# Patient Record
Sex: Female | Born: 1940 | Race: White | Hispanic: No | Marital: Married | Smoking: Never smoker
Health system: Southern US, Community
[De-identification: ages and names within clinical notes are randomized; demographics above are authoritative.]

## PROBLEM LIST (undated history)

## (undated) DIAGNOSIS — M810 Age-related osteoporosis without current pathological fracture: Secondary | ICD-10-CM

## (undated) DIAGNOSIS — M1990 Unspecified osteoarthritis, unspecified site: Secondary | ICD-10-CM

## (undated) DIAGNOSIS — E7800 Pure hypercholesterolemia, unspecified: Secondary | ICD-10-CM

## (undated) DIAGNOSIS — I10 Essential (primary) hypertension: Secondary | ICD-10-CM

## (undated) HISTORY — PX: ABDOMINAL HYSTERECTOMY: SHX81

## (undated) HISTORY — PX: CHOLECYSTECTOMY: SHX55

## (undated) HISTORY — PX: TONSILLECTOMY: SUR1361

---

## 2020-07-18 ENCOUNTER — Encounter (HOSPITAL_BASED_OUTPATIENT_CLINIC_OR_DEPARTMENT_OTHER): Payer: Self-pay

## 2020-07-18 ENCOUNTER — Emergency Department (HOSPITAL_BASED_OUTPATIENT_CLINIC_OR_DEPARTMENT_OTHER): Payer: Medicare Other

## 2020-07-18 ENCOUNTER — Other Ambulatory Visit: Payer: Self-pay

## 2020-07-18 ENCOUNTER — Inpatient Hospital Stay (HOSPITAL_BASED_OUTPATIENT_CLINIC_OR_DEPARTMENT_OTHER)
Admission: EM | Admit: 2020-07-18 | Discharge: 2020-07-21 | DRG: 872 | Disposition: A | Discharge status: Home / Self Care | Payer: Medicare Other | Attending: Family Medicine | Admitting: Family Medicine

## 2020-07-18 DIAGNOSIS — M810 Age-related osteoporosis without current pathological fracture: Secondary | ICD-10-CM | POA: Diagnosis present

## 2020-07-18 DIAGNOSIS — K529 Noninfective gastroenteritis and colitis, unspecified: Principal | ICD-10-CM | POA: Diagnosis present

## 2020-07-18 DIAGNOSIS — A419 Sepsis, unspecified organism: Secondary | ICD-10-CM | POA: Diagnosis not present

## 2020-07-18 DIAGNOSIS — E7800 Pure hypercholesterolemia, unspecified: Secondary | ICD-10-CM | POA: Diagnosis present

## 2020-07-18 DIAGNOSIS — E860 Dehydration: Secondary | ICD-10-CM | POA: Diagnosis not present

## 2020-07-18 DIAGNOSIS — E876 Hypokalemia: Secondary | ICD-10-CM | POA: Diagnosis present

## 2020-07-18 DIAGNOSIS — Z90710 Acquired absence of both cervix and uterus: Secondary | ICD-10-CM

## 2020-07-18 DIAGNOSIS — D649 Anemia, unspecified: Secondary | ICD-10-CM | POA: Diagnosis present

## 2020-07-18 DIAGNOSIS — D72829 Elevated white blood cell count, unspecified: Secondary | ICD-10-CM

## 2020-07-18 DIAGNOSIS — M069 Rheumatoid arthritis, unspecified: Secondary | ICD-10-CM | POA: Diagnosis present

## 2020-07-18 DIAGNOSIS — I10 Essential (primary) hypertension: Secondary | ICD-10-CM | POA: Diagnosis present

## 2020-07-18 DIAGNOSIS — Z20822 Contact with and (suspected) exposure to covid-19: Secondary | ICD-10-CM | POA: Diagnosis present

## 2020-07-18 DIAGNOSIS — Z9049 Acquired absence of other specified parts of digestive tract: Secondary | ICD-10-CM

## 2020-07-18 HISTORY — DX: Pure hypercholesterolemia, unspecified: E78.00

## 2020-07-18 HISTORY — DX: Essential (primary) hypertension: I10

## 2020-07-18 HISTORY — DX: Unspecified osteoarthritis, unspecified site: M19.90

## 2020-07-18 HISTORY — DX: Age-related osteoporosis without current pathological fracture: M81.0

## 2020-07-18 LAB — CBC
HCT: 40 % (ref 36.0–46.0)
Hemoglobin: 11.7 g/dL — ABNORMAL LOW (ref 12.0–15.0)
MCH: 23.9 pg — ABNORMAL LOW (ref 26.0–34.0)
MCHC: 29.3 g/dL — ABNORMAL LOW (ref 30.0–36.0)
MCV: 81.8 fL (ref 80.0–100.0)
Platelets: 576 10*3/uL — ABNORMAL HIGH (ref 150–400)
RBC: 4.89 MIL/uL (ref 3.87–5.11)
RDW: 17.1 % — ABNORMAL HIGH (ref 11.5–15.5)
WBC: 21.4 10*3/uL — ABNORMAL HIGH (ref 4.0–10.5)
nRBC: 0 % (ref 0.0–0.2)

## 2020-07-18 LAB — COMPREHENSIVE METABOLIC PANEL
ALT: 12 U/L (ref 0–44)
AST: 22 U/L (ref 15–41)
Albumin: 3.6 g/dL (ref 3.5–5.0)
Alkaline Phosphatase: 81 U/L (ref 38–126)
Anion gap: 13 (ref 5–15)
BUN: 24 mg/dL — ABNORMAL HIGH (ref 8–23)
CO2: 25 mmol/L (ref 22–32)
Calcium: 9.3 mg/dL (ref 8.9–10.3)
Chloride: 97 mmol/L — ABNORMAL LOW (ref 98–111)
Creatinine, Ser: 0.8 mg/dL (ref 0.44–1.00)
GFR, Estimated: >60 mL/min (ref >60)
Glucose, Bld: 155 mg/dL — ABNORMAL HIGH (ref 70–99)
Potassium: 4.1 mmol/L (ref 3.5–5.1)
Sodium: 135 mmol/L (ref 135–145)
Total Bilirubin: 0.7 mg/dL (ref 0.3–1.2)
Total Protein: 6.7 g/dL (ref 6.5–8.1)

## 2020-07-18 LAB — LACTIC ACID, PLASMA
Lactic Acid, Venous: 3.8 mmol/L (ref 0.5–1.9)
Lactic Acid, Venous: 2.9 mmol/L (ref 0.5–1.9)

## 2020-07-18 LAB — LIPASE, BLOOD: Lipase: 19 U/L (ref 11–51)

## 2020-07-18 MED ORDER — METRONIDAZOLE IN NACL 5-0.79 MG/ML-% IV SOLN
500.0000 mg | Freq: Three times a day (TID) | INTRAVENOUS | Status: DC
  Administered 2020-07-18 – 2020-07-21 (×8): 500 mg via INTRAVENOUS
  Filled 2020-07-18 (×9): qty 100

## 2020-07-18 MED ORDER — SODIUM CHLORIDE 0.9 % IV BOLUS
1000.0000 mL | Freq: Once | INTRAVENOUS | Status: AC
  Administered 2020-07-18: 1000 mL via INTRAVENOUS

## 2020-07-18 MED ORDER — SODIUM CHLORIDE 0.9 % IV BOLUS
500.0000 mL | Freq: Once | INTRAVENOUS | Status: AC
  Administered 2020-07-18: 500 mL via INTRAVENOUS

## 2020-07-18 MED ORDER — IOHEXOL 300 MG/ML  SOLN
100.0000 mL | Freq: Once | INTRAMUSCULAR | Status: AC | PRN
  Administered 2020-07-18: 75 mL via INTRAVENOUS

## 2020-07-18 MED ORDER — VANCOMYCIN 50 MG/ML ORAL SOLUTION
500.0000 mg | Freq: Four times a day (QID) | ORAL | Status: DC
  Administered 2020-07-19: 500 mg via ORAL
  Filled 2020-07-18 (×7): qty 10

## 2020-07-18 NOTE — ED Notes (Signed)
Date and time results received: 07/18/20 2032  Test: lactic acid Critical Value: 3.8 Name of Provider Notified: Dr. Madilyn Hook  Orders Received? Or Actions Taken?: no new orders

## 2020-07-18 NOTE — ED Triage Notes (Signed)
Pt reports generalized abdominal pain and dirrhea since this morning. Pt also report dizziness and feeling weak along with not eating or drinking much today.

## 2020-07-18 NOTE — ED Notes (Signed)
Date and time results received: 07/18/20 2240 (use smartphrase ".now" to insert current time)  Test: Lactic acid  Critical Value: 2.9  Name of Provider Notified: Madilyn Hook  Orders Received? Or Actions Taken?: none

## 2020-07-18 NOTE — ED Provider Notes (Signed)
[facility] EMERGENCY DEPARTMENT Provider Note   CSN: [id number] Arrival date & time: 07/18/20  1810     History Chief Complaint  Patient presents with  . Diarrhea    Carol Baxter is a 80 y.o. female.  The history is provided by the patient, medical records and the spouse.  Diarrhea  Carol Baxter is a 80 y.o. female who presents to the Emergency Department complaining of diarrhea. She presents the emergency department accompanied by her husband for evaluation of diarrhea that began this morning. She reports profuse diarrhea, to numerous to count episodes since 10 AM. She has mild abdominal discomfort. She does have episodic belts with diarrhea but this is the most severe episode she is had. She denies any fevers, chest pain, nausea, vomiting, dysuria. She did have a course of ciprofloxacin a few weeks ago for an eye infection. She has a history of hypertension, rheumatoid arthritis. Symptoms are severe and constant nature.    Past Medical History:  Diagnosis Date  . Arthritis   . High cholesterol   . Hypertension   . Osteoporosis     There are no problems to display for this patient.   Past Surgical History:  Procedure Laterality Date  . ABDOMINAL HYSTERECTOMY    . CESAREAN SECTION    . CHOLECYSTECTOMY    . TONSILLECTOMY       OB History   No obstetric history on file.     History reviewed. No pertinent family history.  Social History   Tobacco Use  . Smoking status: Never Smoker  Substance Use Topics  . Alcohol use: Never  . Drug use: Never    Home Medications Prior to Admission medications   Not on File    Allergies    Demerol [meperidine hcl] and Penicillins  Review of Systems   Review of Systems  Gastrointestinal: Positive for diarrhea.  All other systems reviewed and are negative.   Physical Exam Updated Vital Signs BP (!) 102/49   Pulse 74   Temp (!) 97.4 F (36.3 C) (Oral)   Resp 13   Ht 5' (1.524 m)   Wt 45.4 kg   SpO2  100%   BMI 19.53 kg/m   Physical Exam Vitals and nursing note reviewed.  Constitutional:      Appearance: She is well-developed and well-nourished.  HENT:     Head: Normocephalic and atraumatic.  Cardiovascular:     Rate and Rhythm: Regular rhythm. Tachycardia present.  Pulmonary:     Effort: Pulmonary effort is normal. No respiratory distress.  Abdominal:     Palpations: Abdomen is soft.     Tenderness: There is no abdominal tenderness. There is no guarding or rebound.  Musculoskeletal:        General: No tenderness or edema.  Skin:    General: Skin is warm and dry.  Neurological:     Mental Status: She is alert and oriented to person, place, and time.  Psychiatric:        Mood and Affect: Mood and affect normal.        Behavior: Behavior normal.     ED Results / Procedures / Treatments   Labs (all labs ordered are listed, but only abnormal results are displayed) Labs Reviewed  COMPREHENSIVE METABOLIC PANEL - Abnormal; Notable for the following components:      Result Value   Chloride 97 (*)    Glucose, Bld 155 (*)    BUN 24 (*)    All other components  within normal limits  CBC - Abnormal; Notable for the following components:   WBC 21.4 (*)    Hemoglobin 11.7 (*)    MCH 23.9 (*)    MCHC 29.3 (*)    RDW 17.1 (*)    Platelets 576 (*)    All other components within normal limits  LACTIC ACID, PLASMA - Abnormal; Notable for the following components:   Lactic Acid, Venous 3.8 (*)    All other components within normal limits  LACTIC ACID, PLASMA - Abnormal; Notable for the following components:   Lactic Acid, Venous 2.9 (*)    All other components within normal limits  C DIFFICILE QUICK SCREEN W PCR REFLEX  RESP PANEL BY RT-PCR (FLU A&B, COVID) [id number]  GASTROINTESTINAL PANEL BY PCR, STOOL (REPLACES STOOL CULTURE)  LIPASE, BLOOD  URINALYSIS, ROUTINE W REFLEX MICROSCOPIC    EKG EKG Interpretation  Date/Time:  Wednesday July 18 2020 18:21:47  EST Ventricular Rate:  104 PR Interval:  128 QRS Duration: 82 QT Interval:  320 QTC Calculation: 420 R Axis:   87 Text Interpretation: Sinus tachycardia Biatrial enlargement Abnormal ECG Confirmed by Tilden Fossa [phone]) on 07/18/2020 8:01:24 PM   Radiology CT Abdomen Pelvis W Contrast  Result Date: 07/18/2020 CLINICAL DATA:  Abdomen pain EXAM: CT ABDOMEN AND PELVIS WITH CONTRAST TECHNIQUE: Multidetector CT imaging of the abdomen and pelvis was performed using the standard protocol following bolus administration of intravenous contrast. CONTRAST:  54mL OMNIPAQUE IOHEXOL 300 MG/ML  SOLN COMPARISON:  None. FINDINGS: Lower chest: Lung bases demonstrate no acute consolidation or effusion. Normal cardiac size. Hepatobiliary: No focal hepatic abnormality. Status post cholecystectomy. Moderate intra and extrahepatic biliary dilatation, extrahepatic common bile duct measures up to 13 mm. No calcified stones. Pancreas: Unremarkable. No pancreatic ductal dilatation or surrounding inflammatory changes. Spleen: Normal in size without focal abnormality. Adrenals/Urinary Tract: Adrenal glands are normal. Low-density lesions within the bilateral kidneys consistent with cysts. The bladder is unremarkable. Stomach/Bowel: The stomach is nonenlarged. No dilated small bowel. Appendix not well seen but no right lower quadrant inflammatory process. Fluid throughout the colon with mild diffuse wall thickening. No intramural air. Vascular/Lymphatic: Advanced aortic atherosclerosis. No aneurysm. Portal vessels appear patent. No suspicious nodes. Reproductive: Status post hysterectomy. No adnexal masses. Other: Negative for free air or free fluid. Musculoskeletal: Age indeterminate superior endplate deformities at L1, L2 and L3. IMPRESSION: 1. Fluid throughout the colon with mild diffuse wall thickening, consistent with a pancolitis of infectious or inflammatory etiology, less likely ischemic given diffuse involvement. Consider  C difficile in the appropriate clinical setting. 2. Moderate intra and extrahepatic biliary dilatation status post cholecystectomy. Recommend correlation with LFTs with possible MR or ERCP follow-up. 3. Age indeterminate superior endplate deformities at L1, L2 and L3. 4. Aortic atherosclerosis. Aortic Atherosclerosis (ICD10-I70.0). Electronically Signed   By: Jasmine Pang M.D.   On: 07/18/2020 22:03    Procedures Procedures (including critical care time) CRITICAL CARE Performed by: Tilden Fossa   Total critical care time: 35 minutes  Critical care time was exclusive of separately billable procedures and treating other patients.  Critical care was necessary to treat or prevent imminent or life-threatening deterioration.  Critical care was time spent personally by me on the following activities: development of treatment plan with patient and/or surrogate as well as nursing, discussions with consultants, evaluation of patient's response to treatment, examination of patient, obtaining history from patient or surrogate, ordering and performing treatments and interventions, ordering and review of laboratory studies, ordering and review of  radiographic studies, pulse oximetry and re-evaluation of patient's condition.  Medications Ordered in ED Medications  vancomycin (VANCOCIN) 50 mg/mL oral solution 500 mg (has no administration in time range)  metroNIDAZOLE (FLAGYL) IVPB 500 mg (500 mg Intravenous New Bag/Given 07/18/20 2227)  sodium chloride 0.9 % bolus 1,000 mL (0 mLs Intravenous Stopped 07/18/20 2159)  sodium chloride 0.9 % bolus 500 mL (0 mLs Intravenous Stopped 07/18/20 2159)  iohexol (OMNIPAQUE) 300 MG/ML solution 100 mL (75 mLs Intravenous Contrast Given 07/18/20 2135)    ED Course  I have reviewed the triage vital signs and the nursing notes.  Pertinent labs & imaging results that were available during my care of the patient were reviewed by me and considered in my medical decision making  (see chart for details).    MDM Rules/Calculators/A&P                          Patient here for evaluation of diarrhea that started today. She was hypotensive on ED presentation, blood pressure did improve after IV fluid hydration. CBC with leukocytosis. Mild elevation and BUN consistent with dehydration. CT scan concerning for colitis. She has recently been on antibiotics, concern for C diff colitis. Will send a stool sample and started on empiric treatment. Patient is feeling improved after IV fluids and antibiotics. Given her transient hypotension, elevation and lactate and profound diarrhea plan to admit for ongoing treatment. Patient is in agreement with treatment plan. Hospitalist consulted for admission. Final Clinical Impression(s) / ED Diagnoses Final diagnoses:  Colitis  Dehydration    Rx / DC Orders ED Discharge Orders    None       Quintella Reichert, MD 07/18/20 2309

## 2020-07-19 ENCOUNTER — Telehealth (HOSPITAL_BASED_OUTPATIENT_CLINIC_OR_DEPARTMENT_OTHER): Payer: Self-pay | Admitting: Emergency Medicine

## 2020-07-19 ENCOUNTER — Encounter (HOSPITAL_COMMUNITY): Payer: Self-pay | Admitting: Family Medicine

## 2020-07-19 DIAGNOSIS — E7800 Pure hypercholesterolemia, unspecified: Secondary | ICD-10-CM | POA: Diagnosis present

## 2020-07-19 DIAGNOSIS — A419 Sepsis, unspecified organism: Secondary | ICD-10-CM | POA: Diagnosis present

## 2020-07-19 DIAGNOSIS — Z90710 Acquired absence of both cervix and uterus: Secondary | ICD-10-CM | POA: Diagnosis not present

## 2020-07-19 DIAGNOSIS — Z9049 Acquired absence of other specified parts of digestive tract: Secondary | ICD-10-CM | POA: Diagnosis not present

## 2020-07-19 DIAGNOSIS — Z20822 Contact with and (suspected) exposure to covid-19: Secondary | ICD-10-CM | POA: Diagnosis present

## 2020-07-19 DIAGNOSIS — K529 Noninfective gastroenteritis and colitis, unspecified: Secondary | ICD-10-CM

## 2020-07-19 DIAGNOSIS — E860 Dehydration: Secondary | ICD-10-CM | POA: Diagnosis present

## 2020-07-19 DIAGNOSIS — R6520 Severe sepsis without septic shock: Secondary | ICD-10-CM | POA: Diagnosis not present

## 2020-07-19 DIAGNOSIS — D72829 Elevated white blood cell count, unspecified: Secondary | ICD-10-CM

## 2020-07-19 DIAGNOSIS — M069 Rheumatoid arthritis, unspecified: Secondary | ICD-10-CM | POA: Diagnosis present

## 2020-07-19 DIAGNOSIS — M810 Age-related osteoporosis without current pathological fracture: Secondary | ICD-10-CM | POA: Diagnosis present

## 2020-07-19 DIAGNOSIS — E876 Hypokalemia: Secondary | ICD-10-CM | POA: Diagnosis present

## 2020-07-19 DIAGNOSIS — I10 Essential (primary) hypertension: Secondary | ICD-10-CM | POA: Diagnosis present

## 2020-07-19 DIAGNOSIS — D649 Anemia, unspecified: Secondary | ICD-10-CM | POA: Diagnosis present

## 2020-07-19 LAB — GASTROINTESTINAL PANEL BY PCR, STOOL (REPLACES STOOL CULTURE)

## 2020-07-19 LAB — CBC
HCT: 33.3 % — ABNORMAL LOW (ref 36.0–46.0)
Hemoglobin: 9.7 g/dL — ABNORMAL LOW (ref 12.0–15.0)
MCH: 23.7 pg — ABNORMAL LOW (ref 26.0–34.0)
MCHC: 29.1 g/dL — ABNORMAL LOW (ref 30.0–36.0)
MCV: 81.2 fL (ref 80.0–100.0)
Platelets: 475 10*3/uL — ABNORMAL HIGH (ref 150–400)
RBC: 4.1 MIL/uL (ref 3.87–5.11)
RDW: 17 % — ABNORMAL HIGH (ref 11.5–15.5)
WBC: 9.7 10*3/uL (ref 4.0–10.5)
nRBC: 0 % (ref 0.0–0.2)

## 2020-07-19 LAB — RESP PANEL BY RT-PCR (FLU A&B, COVID) ARPGX2
Influenza A by PCR: NEGATIVE
Influenza B by PCR: NEGATIVE
SARS Coronavirus 2 by RT PCR: NEGATIVE

## 2020-07-19 LAB — C DIFFICILE QUICK SCREEN W PCR REFLEX
C Diff antigen: NEGATIVE
C Diff interpretation: NOT DETECTED
C Diff toxin: NEGATIVE

## 2020-07-19 LAB — CREATININE, SERUM
Creatinine, Ser: 0.82 mg/dL (ref 0.44–1.00)
GFR, Estimated: >60 mL/min (ref >60)

## 2020-07-19 MED ORDER — POLYVINYL ALCOHOL 1.4 % OP SOLN
1.0000 [drp] | Freq: Three times a day (TID) | OPHTHALMIC | Status: DC | PRN
  Filled 2020-07-19: qty 15

## 2020-07-19 MED ORDER — SIMVASTATIN 20 MG PO TABS
20.0000 mg | ORAL_TABLET | Freq: Every day | ORAL | Status: DC
  Administered 2020-07-19 – 2020-07-21 (×3): 20 mg via ORAL
  Filled 2020-07-19 (×3): qty 1

## 2020-07-19 MED ORDER — SPIRONOLACTONE 25 MG PO TABS
25.0000 mg | ORAL_TABLET | Freq: Every day | ORAL | Status: DC
  Administered 2020-07-19 – 2020-07-21 (×3): 25 mg via ORAL
  Filled 2020-07-19 (×3): qty 1

## 2020-07-19 MED ORDER — EZETIMIBE-SIMVASTATIN 10-20 MG PO TABS
1.0000 | ORAL_TABLET | Freq: Every day | ORAL | Status: DC
  Filled 2020-07-19: qty 1

## 2020-07-19 MED ORDER — EZETIMIBE 10 MG PO TABS
10.0000 mg | ORAL_TABLET | Freq: Every day | ORAL | Status: DC
  Administered 2020-07-19 – 2020-07-21 (×3): 10 mg via ORAL
  Filled 2020-07-19 (×3): qty 1

## 2020-07-19 MED ORDER — ONDANSETRON HCL 4 MG PO TABS: 4.0000 mg | ORAL_TABLET | Freq: Four times a day (QID) | ORAL | Status: DC | PRN

## 2020-07-19 MED ORDER — ACETAMINOPHEN 325 MG PO TABS: 650.0000 mg | ORAL_TABLET | Freq: Four times a day (QID) | ORAL | Status: DC | PRN

## 2020-07-19 MED ORDER — ENOXAPARIN SODIUM 30 MG/0.3ML ~~LOC~~ SOLN
30.0000 mg | SUBCUTANEOUS | Status: DC
  Administered 2020-07-19 – 2020-07-21 (×3): 30 mg via SUBCUTANEOUS
  Filled 2020-07-19 (×3): qty 0.3

## 2020-07-19 MED ORDER — LACTATED RINGERS IV SOLN: INTRAVENOUS | Status: DC

## 2020-07-19 MED ORDER — ONDANSETRON HCL 4 MG/2ML IJ SOLN: 4.0000 mg | Freq: Four times a day (QID) | INTRAMUSCULAR | Status: DC | PRN

## 2020-07-19 MED ORDER — HYPROMELLOSE (GONIOSCOPIC) 2.5 % OP SOLN: 1.0000 [drp] | Freq: Three times a day (TID) | OPHTHALMIC | Status: DC | PRN

## 2020-07-19 MED ORDER — CIPROFLOXACIN IN D5W 400 MG/200ML IV SOLN
400.0000 mg | Freq: Two times a day (BID) | INTRAVENOUS | Status: DC
  Administered 2020-07-19 – 2020-07-21 (×4): 400 mg via INTRAVENOUS
  Filled 2020-07-19 (×4): qty 200

## 2020-07-19 MED ORDER — ACETAMINOPHEN 650 MG RE SUPP: 650.0000 mg | Freq: Four times a day (QID) | RECTAL | Status: DC | PRN

## 2020-07-19 NOTE — H&P (Signed)
History and Physical    Carol Baxter ZOX:[id number] DOB: 1940/07/28 DOA: 07/18/2020  PCP: System, Provider Not In   Patient coming from:  Home  Chief Complaint:  Diarrhea, abdominal pain  HPI: Carol Baxter is a 80 y.o. female with medical history significant for HTN, RA who presents complaining of diarrhea. She reports that diarrhea started at 10 am on 07/18/20 and has had multiple bouts since then.  She reports profuse diarrhea that has not had blood visible in it. She has abdominal discomfort that worsened over the course of the day prompting her to come for evaluation. She does have episodic bouts with diarrhea but this is the most severe episode she has had. She denies any fevers, chest pain, nausea, vomiting, dysuria. She did have a course of ciprofloxacin a few weeks ago for an eye infection. She has a history of hypertension, rheumatoid arthritis. RA is not being treated at this time.   ED Course: CT scan showed diffuse wall thickening of the colon consistent with colitis.  With recent ciprofloxacin use C. difficile is to be strongly considered.  Stool for C. difficile was sent to the lab by the ER and is pending.  Patient was treated in the emergency room with p.o. vancomycin and IV Flagyl.  Hospitalist service has been asked to admit for further management  Review of Systems:  General: Denies fever, chills, weight loss, night sweats.  Denies dizziness. Reports decreased appetite HENT: Denies head trauma, headache, denies change in hearing, tinnitus.  Denies nasal congestion or bleeding.  Denies sore throat, sores in mouth.  Denies difficulty swallowing Eyes: Denies blurry vision, pain in eye, drainage.  Denies discoloration of eyes. Neck: Denies pain.  Denies swelling.  Denies pain with movement. Cardiovascular: Denies chest pain, palpitations.  Denies edema.  Denies orthopnea Respiratory: Denies shortness of breath, cough.  Denies wheezing.  Denies sputum production Gastrointestinal: Reports  abdominal pain. Reports diarrhea. Denies nausea, vomiting.  Denies melena.  Denies hematemesis. Musculoskeletal: Denies limitation of movement.  Denies deformity or swelling.  Denies pain.  Denies arthralgias or myalgias. Genitourinary: Denies pelvic pain.  Denies urinary frequency or hesitancy.  Denies dysuria.  Skin: Denies rash.  Denies petechiae, purpura, ecchymosis. Neurological: Denies syncope.  Denies seizure activity.  Denies weakness or paresthesia.  Denies slurred speech, drooping face.  Denies visual change. Psychiatric: Denies depression, anxiety.  Denies hallucinations.  Past Medical History:  Diagnosis Date  . Arthritis   . High cholesterol   . Hypertension   . Osteoporosis     Past Surgical History:  Procedure Laterality Date  . ABDOMINAL HYSTERECTOMY    . CESAREAN SECTION    . CHOLECYSTECTOMY    . TONSILLECTOMY      Social History  reports that she has never smoked. She has never used smokeless tobacco. She reports that she does not drink alcohol and does not use drugs.  Allergies  Allergen Reactions  . Demerol [Meperidine Hcl] Nausea And Vomiting  . Pentothal [Thiopental] Nausea And Vomiting  . Penicillins Swelling and Rash    History reviewed. No pertinent family history.   Prior to Admission medications   Medication Sig Start Date End Date Taking? Authorizing Provider  aspirin-acetaminophen-caffeine (EXCEDRIN EXTRA STRENGTH) [phone] MG tablet Take 2 tablets by mouth every 6 (six) hours as needed for headache or migraine (pain).   Yes [provider]  chlorpheniramine (CHLOR-TRIMETON) 4 MG tablet Take 4 mg by mouth every 4 (four) hours. Pt takes every 4 hours due to sinus  Yes [provider]  cholecalciferol (VITAMIN D3) 25 MCG (1000 UNIT) tablet Take 4,000 Units by mouth daily.   Yes [provider]  ezetimibe-simvastatin (VYTORIN) 10-20 MG tablet Take 1 tablet by mouth daily.   Yes [provider]  hydroxypropyl  methylcellulose / hypromellose (ISOPTO TEARS / GONIOVISC) 2.5 % ophthalmic solution Place 1 drop into both eyes 3 (three) times daily as needed for dry eyes.   Yes [provider]  loperamide (IMODIUM) 2 MG capsule Take 4 mg by mouth as needed for diarrhea or loose stools.   Yes [provider]  montelukast (SINGULAIR) 10 MG tablet Take 10 mg by mouth at bedtime.   Yes [provider]  pseudoephedrine (SUDAFED) 60 MG tablet Take 60 mg by mouth every 4 (four) hours. Pt takes every 4 hours due to sinus problems   Yes [provider]  spironolactone (ALDACTONE) 25 MG tablet Take 25 mg by mouth See admin instructions. Pt takes daily but May take additional dose if bloated   Yes [provider]    Physical Exam: Vitals:   07/19/20 0030 07/19/20 0100 07/19/20 0136 07/19/20 0255  BP: (!) 92/48 (!) 92/51 (!) 101/48 (!) 121/57  Pulse: 71 71 71 75  Resp: (!) 25 13 11 14   Temp:   (!) 97.4 F (36.3 C) 98.3 F (36.8 C)  TempSrc:   Oral Oral  SpO2: 95% 96% 99% 99%  Weight:    41 kg  Height:        Constitutional: NAD, calm, comfortable Vitals:   07/19/20 0030 07/19/20 0100 07/19/20 0136 07/19/20 0255  BP: (!) 92/48 (!) 92/51 (!) 101/48 (!) 121/57  Pulse: 71 71 71 75  Resp: (!) 25 13 11 14   Temp:   (!) 97.4 F (36.3 C) 98.3 F (36.8 C)  TempSrc:   Oral Oral  SpO2: 95% 96% 99% 99%  Weight:    41 kg  Height:       General: WDWN, Alert and oriented x3.  Eyes: EOMI, PERRL, conjunctivae normal.  Sclera nonicteric HENT:  Carbon/AT, external ears normal. Nares patent without epistasis. Mucous membranes are dry.  Neck: Soft, normal range of motion, supple, no masses, Trachea midline Respiratory: clear to auscultation bilaterally, no wheezing, no crackles. Normal respiratory effort. No accessory muscle use.  Cardiovascular: Regular rate and rhythm, no murmurs / rubs / gallops. No extremity edema. Abdomen: Soft, mild diffuse tenderness, nondistended, no  rebound or guarding. No masses palpated. No hepatosplenomegaly. Bowel sounds normoactive Musculoskeletal: FROM. No cyanosis. No joint deformity upper and lower extremities. Normal muscle tone.  Skin: Warm, dry, intact no rashes, lesions, ulcers. No induration. Decreased turgor Neurologic: CN 2-12 grossly intact.  Normal speech.  Sensation intact, Strength 5/5 in all extremities.   Psychiatric: Normal judgment and insight.  Normal mood.    Labs on Admission: I have personally reviewed following labs and imaging studies  CBC: Recent Labs  Lab 07/18/20 1839  WBC 21.4*  HGB 11.7*  HCT 40.0  MCV 81.8  PLT 576*    Basic Metabolic Panel: Recent Labs  Lab 07/18/20 1839  NA 135  K 4.1  CL 97*  CO2 25  GLUCOSE 155*  BUN 24*  CREATININE 0.80  CALCIUM 9.3    GFR: Estimated Creatinine Clearance: 37.5 mL/min (by C-G formula based on SCr of 0.8 mg/dL).  Liver Function Tests: Recent Labs  Lab 07/18/20 1839  AST 22  ALT 12  ALKPHOS 81  BILITOT 0.7  PROT 6.7  ALBUMIN 3.6    Urine analysis: No results found for: COLORURINE, APPEARANCEUR, LABSPEC, PHURINE, GLUCOSEU, HGBUR, BILIRUBINUR, KETONESUR, PROTEINUR, UROBILINOGEN, NITRITE, LEUKOCYTESUR  Radiological Exams on Admission: CT Abdomen Pelvis W Contrast  Result Date: 07/18/2020 CLINICAL DATA:  Abdomen pain EXAM: CT ABDOMEN AND PELVIS WITH CONTRAST TECHNIQUE: Multidetector CT imaging of the abdomen and pelvis was performed using the standard protocol following bolus administration of intravenous contrast. CONTRAST:  34mL OMNIPAQUE IOHEXOL 300 MG/ML  SOLN COMPARISON:  None. FINDINGS: Lower chest: Lung bases demonstrate no acute consolidation or effusion. Normal cardiac size. Hepatobiliary: No focal hepatic abnormality. Status post cholecystectomy. Moderate intra and extrahepatic biliary dilatation, extrahepatic common bile duct measures up to 13 mm. No calcified stones. Pancreas: Unremarkable. No pancreatic ductal dilatation or  surrounding inflammatory changes. Spleen: Normal in size without focal abnormality. Adrenals/Urinary Tract: Adrenal glands are normal. Low-density lesions within the bilateral kidneys consistent with cysts. The bladder is unremarkable. Stomach/Bowel: The stomach is nonenlarged. No dilated small bowel. Appendix not well seen but no right lower quadrant inflammatory process. Fluid throughout the colon with mild diffuse wall thickening. No intramural air. Vascular/Lymphatic: Advanced aortic atherosclerosis. No aneurysm. Portal vessels appear patent. No suspicious nodes. Reproductive: Status post hysterectomy. No adnexal masses. Other: Negative for free air or free fluid. Musculoskeletal: Age indeterminate superior endplate deformities at L1, L2 and L3. IMPRESSION: 1. Fluid throughout the colon with mild diffuse wall thickening, consistent with a pancolitis of infectious or inflammatory etiology, less likely ischemic given diffuse involvement. Consider C difficile in the appropriate clinical setting. 2. Moderate intra and extrahepatic biliary dilatation status post cholecystectomy. Recommend correlation with LFTs with possible MR or ERCP follow-up. 3. Age indeterminate superior endplate deformities at L1, L2 and L3. 4. Aortic atherosclerosis. Aortic Atherosclerosis (ICD10-I70.0). Electronically Signed   By: Jasmine Pang M.D.   On: 07/18/2020 22:03    EKG: Independently reviewed.  EKG is reviewed and shows sinus tachycardia with heart rate of 104.  No acute ST elevation or depression.  QTc 420  Assessment/Plan Principal Problem:   Colitis Ms. Buske is admitted to Med-Surg floor.  She was started on po vancomycin in the ER for suspected C. Difficle colitis as pt was recently treated with cipro and has CT findings consistent with C.Diff colitis. She was given dose of Flagyl in the ER but this is not continued as it is not recommended first line therapy for C. Difficle colitis.  Check CBC, BMP in am IVF  hydration with LR provided.   Active Problems:   Sepsis  She meets sepsis criteria with Colitis with elevated WBC of 21,000, initial low BP, tachycardia and elevated Lactic acid level.     Dehydration IVF hydration with LR at 100 ml/hr Clear liquid diet which will be advanced as tolerated.    Essential hypertension Continue home dose of spironolactone now that pt is stabilized.     Leukocytosis Monitor CBC    DVT prophylaxis: Lovenox for DVT prophylaxis Code Status:   Full code Family Communication:  Diagnosis and plan discussed with the patient.  Questions answered.  Further recommendations to follow as clinically indicated Disposition Plan:   Patient is from:  Home  Anticipated DC to:  Home  Anticipated DC date:  Anticipate at least 2 midnight stay to treat acute medical condition  Anticipated DC barriers: No barriers to discharge identified at this time   Admission status:  Inpatient   Carlton Adam MD [facility]  How to contact the [facility] Attending or Consulting provider  7A - 7P or covering provider during after hours 7P -7A, for this patient?   1. Check the care team in [facility] and look for a) attending/consulting [facility] provider listed and b) the [facility] team listed 2. Log into [email or web address] and use [facility] universal password to access. If you do not have the password, please contact the hospital operator. 3. Locate the [facility] provider you are looking for under [facility] and page to a number that you can be directly reached. 4. If you still have difficulty reaching the provider, please page the [facility] (Director on Call) for the Hospitalists listed on amion for assistance.  07/19/2020, 5:43 AM

## 2020-07-19 NOTE — ED Notes (Signed)
Verbal Order from Zierle-Ghosh, DO to change admission orders.

## 2020-07-19 NOTE — Progress Notes (Signed)
Subjective: Patient admitted this morning, see detailed H&P by Dr Rachael Darby 80 year old female with history of hypertension, rheumatoid arthritis presents with diarrhea.  CT abdomen pelvis showed diffuse wall thickening of the colon consistent with pancolitis.  Patient was started on p.o. vancomycin empirically however stool for C. difficile was negative.  P.o. vancomycin has been discontinued.  Patient is currently on IV Flagyl.  Vitals:   07/19/20 1147 07/19/20 1401  BP: 120/61 123/67  Pulse: 75 76  Resp: 15 15  Temp: 98.4 F (36.9 C) 98.2 F (36.8 C)  SpO2: 100% 99%      A/P  Colitis C. difficile PCR negative for antigen and toxin. P.o. vancomycin has been discontinued. Unclear etiology of colitis infectious versus inflammatory.  Called and discussed with [facility].  They will see patient in AM. Add ciprofloxacin 400 mg IV every 12 hours. Continue Flagyl 500 mg IV every 8 hours.  Hypertension Continue Aldactone  Sepsis Sepsis physiology has resolved, patient presented with hypotension, leukocytosis, tachycardia, elevated lactic acid level.  Secondary to colitis as above.   Meredeth Ide [facility] [phone]

## 2020-07-20 DIAGNOSIS — A419 Sepsis, unspecified organism: Secondary | ICD-10-CM | POA: Diagnosis not present

## 2020-07-20 DIAGNOSIS — R6520 Severe sepsis without septic shock: Secondary | ICD-10-CM | POA: Diagnosis not present

## 2020-07-20 DIAGNOSIS — I10 Essential (primary) hypertension: Secondary | ICD-10-CM

## 2020-07-20 DIAGNOSIS — K529 Noninfective gastroenteritis and colitis, unspecified: Secondary | ICD-10-CM | POA: Diagnosis not present

## 2020-07-20 LAB — CBC
HCT: 27.4 % — ABNORMAL LOW (ref 36.0–46.0)
Hemoglobin: 8 g/dL — ABNORMAL LOW (ref 12.0–15.0)
MCH: 23.6 pg — ABNORMAL LOW (ref 26.0–34.0)
MCHC: 29.2 g/dL — ABNORMAL LOW (ref 30.0–36.0)
MCV: 80.8 fL (ref 80.0–100.0)
Platelets: 373 10*3/uL (ref 150–400)
RBC: 3.39 MIL/uL — ABNORMAL LOW (ref 3.87–5.11)
RDW: 16.9 % — ABNORMAL HIGH (ref 11.5–15.5)
WBC: 6.2 10*3/uL (ref 4.0–10.5)
nRBC: 0 % (ref 0.0–0.2)

## 2020-07-20 LAB — BASIC METABOLIC PANEL
Anion gap: 10 (ref 5–15)
BUN: 11 mg/dL (ref 8–23)
CO2: 25 mmol/L (ref 22–32)
Calcium: 8.3 mg/dL — ABNORMAL LOW (ref 8.9–10.3)
Chloride: 103 mmol/L (ref 98–111)
Creatinine, Ser: 0.61 mg/dL (ref 0.44–1.00)
GFR, Estimated: >60 mL/min (ref >60)
Glucose, Bld: 77 mg/dL (ref 70–99)
Potassium: 3.3 mmol/L — ABNORMAL LOW (ref 3.5–5.1)
Sodium: 138 mmol/L (ref 135–145)

## 2020-07-20 LAB — PROCALCITONIN: Procalcitonin: 0.37 ng/mL

## 2020-07-20 MED ORDER — POTASSIUM CHLORIDE CRYS ER 20 MEQ PO TBCR
40.0000 meq | EXTENDED_RELEASE_TABLET | Freq: Once | ORAL | Status: AC
  Administered 2020-07-20: 40 meq via ORAL
  Filled 2020-07-20: qty 2

## 2020-07-20 MED ORDER — LOPERAMIDE HCL 2 MG PO CAPS: 2.0000 mg | ORAL_CAPSULE | Freq: Every day | ORAL | Status: DC | PRN

## 2020-07-20 NOTE — Progress Notes (Signed)
[facility]  PROGRESS NOTE  Carol Baxter KGU:[id number] DOB: 18-Apr-1941 DOA: 07/18/2020 PCP: System, Provider Not In   Brief HPI:   *80 year old female with history of hypertension, rheumatoid arthritis presents with diarrhea.  CT abdomen pelvis showed diffuse wall thickening of the colon consistent with pancolitis.  Patient was started on p.o. vancomycin empirically however stool for C. difficile was negative.  P.o. vancomycin has been discontinued.  Patient is currently on IV Flagyl.    Subjective   Patient seen and examined, seen by GI today.  Diarrhea has significantly improved with antibiotics.  Hemoglobin is down to 8.0.   Assessment/Plan:     1. Colitis-C. difficile PCR was negative for antigen and toxin.  P.o. vancomycin was initially started which was discontinued.  CT scan showed diffuse pancolitis infectious versus inflammatory so [facility] was consulted.  GI recommended continuing with ciprofloxacin and Flagyl.  At this time diarrhea has significantly improved.  Plan is to go home with Cipro and Flagyl. 2. Anemia-patient's hemoglobin dropped from 11.7 on the day of admission to 8.0, likely dilutional.  No overt GI bleed.  Will repeat CBC in a.m.  Change IV fluids to [facility]. 3. Hypertension-continue Aldactone. 4. Sepsis-sepsis physiology has resolved, patient presented with hypotension, leukocytosis, tachycardia, elevated lactic acid level.  Likely from colitis as above. 5. Hypokalemia-potassium is 3.3, will give 1 dose of K. Dur 40 mEq p.o. x1.  Check BMP in am.     COVID-19 Labs  No results for input(s): DDIMER, FERRITIN, LDH, CRP in the last 72 hours.  Lab Results  Component Value Date   SARSCOV2NAA NEGATIVE 07/18/2020     Scheduled medications:   . enoxaparin (LOVENOX) injection  30 mg Subcutaneous Q24H  . ezetimibe  10 mg Oral Daily   And  . simvastatin  20 mg Oral Daily  . potassium chloride  40 mEq Oral Once  . spironolactone  25 mg Oral Daily          CBG: No results for input(s): GLUCAP in the last 168 hours.  SpO2: 100 %    CBC: Recent Labs  Lab 07/18/20 1839 07/19/20 0824 07/20/20 0628  WBC 21.4* 9.7 6.2  HGB 11.7* 9.7* 8.0*  HCT 40.0 33.3* 27.4*  MCV 81.8 81.2 80.8  PLT 576* 475* 373    Basic Metabolic Panel: Recent Labs  Lab 07/18/20 1839 07/19/20 0824 07/20/20 0628  NA 135  --  138  K 4.1  --  3.3*  CL 97*  --  103  CO2 25  --  25  GLUCOSE 155*  --  77  BUN 24*  --  11  CREATININE 0.80 0.82 0.61  CALCIUM 9.3  --  8.3*     Liver Function Tests: Recent Labs  Lab 07/18/20 1839  AST 22  ALT 12  ALKPHOS 81  BILITOT 0.7  PROT 6.7  ALBUMIN 3.6     Antibiotics: Anti-infectives (From admission, onward)   Start     Dose/Rate Route Frequency Ordered Stop   07/19/20 1800  ciprofloxacin (CIPRO) IVPB 400 mg        400 mg 200 mL/hr over 60 Minutes Intravenous Every 12 hours 07/19/20 1341     07/19/20 0000  vancomycin (VANCOCIN) 50 mg/mL oral solution 500 mg  Status:  Discontinued        500 mg Oral Every 6 hours 07/18/20 2208 07/19/20 0924   07/18/20 2215  metroNIDAZOLE (FLAGYL) IVPB 500 mg        500  mg 100 mL/hr over 60 Minutes Intravenous Every 8 hours 07/18/20 2208 08/01/20 2159       DVT prophylaxis: Lovenox  Code Status: Full code  Family Communication: No family at bedside   Consultants:    Procedures:      Objective   Vitals:   07/19/20 1401 07/19/20 2122 07/20/20 0503 07/20/20 1408  BP: 123/67 (!) 117/47 (!) 125/58 136/77  Pulse: 76 78 74 81  Resp: 15 16 14 14   Temp: 98.2 F (36.8 C) 98.2 F (36.8 C) 97.9 F (36.6 C) 98.2 F (36.8 C)  TempSrc: Oral Oral Oral Oral  SpO2: 99% 97% 97% 100%  Weight:      Height:       No intake or output data in the 24 hours ending 07/20/20 1446  01/05 1901 - 01/07 0700 In: 1425  Out: 3   Filed Weights   07/18/20 1820 07/19/20 0255  Weight: 45.4 kg 41 kg    Physical Examination:    General-appears in no acute  distress  Heart-S1-S2, regular, no murmur auscultated  Lungs-clear to auscultation bilaterally, no wheezing or crackles auscultated  Abdomen-soft, nontender, no organomegaly  Extremities-no edema in the lower extremities  Neuro-alert, oriented x3, no focal deficit noted   Status is: Inpatient  Dispo: The patient is from: Home              Anticipated d/c is to: Home              Anticipated d/c date is: 07/21/2020              Patient currently not medically stable for discharge  Barrier to discharge-ongoing diarrhea, anemia       Data Reviewed:   Recent Results (from the past 240 hour(s))  Resp Panel by RT-PCR (Flu A&B, Covid) Nasopharyngeal Swab     Status: None   Collection Time: 07/18/20 11:01 PM   Specimen: Nasopharyngeal Swab; Nasopharyngeal(NP) swabs in vial transport medium  Result Value Ref Range Status   SARS Coronavirus 2 by RT PCR NEGATIVE NEGATIVE Final    Comment: (NOTE) SARS-CoV-2 target nucleic acids are NOT DETECTED.  The SARS-CoV-2 RNA is generally detectable in upper respiratory specimens during the acute phase of infection. The lowest concentration of SARS-CoV-2 viral copies this assay can detect is 138 copies/mL. A negative result does not preclude SARS-Cov-2 infection and should not be used as the sole basis for treatment or other patient management decisions. A negative result may occur with  improper specimen collection/handling, submission of specimen other than nasopharyngeal swab, presence of viral mutation(s) within the areas targeted by this assay, and inadequate number of viral copies(<138 copies/mL). A negative result must be combined with clinical observations, patient history, and epidemiological information. The expected result is Negative.  Fact Sheet for Patients:  09/15/20  Fact Sheet for Healthcare Providers:  BloggerCourse.com  This test is no t yet approved or cleared  by the [place] FDA and  has been authorized for detection and/or diagnosis of SARS-CoV-2 by FDA under an Emergency Use Authorization (EUA). This EUA will remain  in effect (meaning this test can be used) for the duration of the COVID-19 declaration under Section 564(b)(1) of the Act, 21 U.S.C.section 360bbb-3(b)(1), unless the authorization is terminated  or revoked sooner.       Influenza A by PCR NEGATIVE NEGATIVE Final   Influenza B by PCR NEGATIVE NEGATIVE Final    Comment: (NOTE) The Xpert Xpress SARS-CoV-2/FLU/RSV plus assay is intended  as an aid in the diagnosis of influenza from Nasopharyngeal swab specimens and should not be used as a sole basis for treatment. Nasal washings and aspirates are unacceptable for Xpert Xpress SARS-CoV-2/FLU/RSV testing.  Fact Sheet for Patients: BloggerCourse.com  Fact Sheet for Healthcare Providers: SeriousBroker.it  This test is not yet approved or cleared by the [place] FDA and has been authorized for detection and/or diagnosis of SARS-CoV-2 by FDA under an Emergency Use Authorization (EUA). This EUA will remain in effect (meaning this test can be used) for the duration of the COVID-19 declaration under Section 564(b)(1) of the Act, 21 U.S.C. section 360bbb-3(b)(1), unless the authorization is terminated or revoked.  Performed at [facility], [address] [place], [place] [address]   C Difficile Quick Screen w PCR reflex     Status: None   Collection Time: 07/19/20  5:53 AM   Specimen: STOOL  Result Value Ref Range Status   C Diff antigen NEGATIVE NEGATIVE Final   C Diff toxin NEGATIVE NEGATIVE Final   C Diff interpretation No C. difficile detected.  Final    Comment: Performed at [facility], [address] [place], [place] [address]  Gastrointestinal Panel by PCR , Stool     Status: None   Collection Time: 07/19/20  5:53 AM    Specimen: STOOL  Result Value Ref Range Status   Campylobacter species NOT DETECTED NOT DETECTED Final   Plesimonas shigelloides NOT DETECTED NOT DETECTED Final   Salmonella species NOT DETECTED NOT DETECTED Final   Yersinia enterocolitica NOT DETECTED NOT DETECTED Final   Vibrio species NOT DETECTED NOT DETECTED Final   Vibrio cholerae NOT DETECTED NOT DETECTED Final   Enteroaggregative E coli (EAEC) NOT DETECTED NOT DETECTED Final   Enteropathogenic E coli (EPEC) NOT DETECTED NOT DETECTED Final   Enterotoxigenic E coli (ETEC) NOT DETECTED NOT DETECTED Final   Shiga like toxin producing E coli (STEC) NOT DETECTED NOT DETECTED Final   Shigella/Enteroinvasive E coli (EIEC) NOT DETECTED NOT DETECTED Final   Cryptosporidium NOT DETECTED NOT DETECTED Final   Cyclospora cayetanensis NOT DETECTED NOT DETECTED Final   Entamoeba histolytica NOT DETECTED NOT DETECTED Final   Giardia lamblia NOT DETECTED NOT DETECTED Final   Adenovirus F40/41 NOT DETECTED NOT DETECTED Final   Astrovirus NOT DETECTED NOT DETECTED Final   Norovirus GI/GII NOT DETECTED NOT DETECTED Final   Rotavirus A NOT DETECTED NOT DETECTED Final   Sapovirus (I, II, IV, and V) NOT DETECTED NOT DETECTED Final    Comment: Performed at [facility], [address] [place], [place] [address]    Recent Labs  Lab 07/18/20 1839  LIPASE 19   No results for input(s): AMMONIA in the last 168 hours.  Cardiac Enzymes: No results for input(s): CKTOTAL, CKMB, CKMBINDEX, TROPONINI in the last 168 hours. BNP (last 3 results) No results for input(s): BNP in the last 8760 hours.  ProBNP (last 3 results) No results for input(s): PROBNP in the last 8760 hours.  Studies:  CT Abdomen Pelvis W Contrast  Result Date: 07/18/2020 CLINICAL DATA:  Abdomen pain EXAM: CT ABDOMEN AND PELVIS WITH CONTRAST TECHNIQUE: Multidetector CT imaging of the abdomen and pelvis was performed using the standard protocol following bolus administration  of intravenous contrast. CONTRAST:  39mL OMNIPAQUE IOHEXOL 300 MG/ML  SOLN COMPARISON:  None. FINDINGS: Lower chest: Lung bases demonstrate no acute consolidation or effusion. Normal cardiac size. Hepatobiliary: No focal hepatic abnormality. Status post cholecystectomy. Moderate  intra and extrahepatic biliary dilatation, extrahepatic common bile duct measures up to 13 mm. No calcified stones. Pancreas: Unremarkable. No pancreatic ductal dilatation or surrounding inflammatory changes. Spleen: Normal in size without focal abnormality. Adrenals/Urinary Tract: Adrenal glands are normal. Low-density lesions within the bilateral kidneys consistent with cysts. The bladder is unremarkable. Stomach/Bowel: The stomach is nonenlarged. No dilated small bowel. Appendix not well seen but no right lower quadrant inflammatory process. Fluid throughout the colon with mild diffuse wall thickening. No intramural air. Vascular/Lymphatic: Advanced aortic atherosclerosis. No aneurysm. Portal vessels appear patent. No suspicious nodes. Reproductive: Status post hysterectomy. No adnexal masses. Other: Negative for free air or free fluid. Musculoskeletal: Age indeterminate superior endplate deformities at L1, L2 and L3. IMPRESSION: 1. Fluid throughout the colon with mild diffuse wall thickening, consistent with a pancolitis of infectious or inflammatory etiology, less likely ischemic given diffuse involvement. Consider C difficile in the appropriate clinical setting. 2. Moderate intra and extrahepatic biliary dilatation status post cholecystectomy. Recommend correlation with LFTs with possible MR or ERCP follow-up. 3. Age indeterminate superior endplate deformities at L1, L2 and L3. 4. Aortic atherosclerosis. Aortic Atherosclerosis (ICD10-I70.0). Electronically Signed   By: Donavan Foil M.D.   On: 07/18/2020 22:03       Oswald Hillock   [facility] If 7PM-7AM, please contact night-coverage at [email or web address], Office   [phone]   07/20/2020, 2:46 PM  LOS: 1 day

## 2020-07-20 NOTE — Consult Note (Signed)
Referring Provider: Dr. Mauro Kaufmann Primary Care Physician:  System, Provider Not In Primary Gastroenterologist:  [facility]  Reason for Consultation:  Diarrhea  HPI: Carol Baxter is a 80 y.o. female with history of RA and remote cholecystectomy presenting for consultation of diarrhea.  Patient states she started having profuse and watery diarrhea starting 1/5.  Denies seeing any melena or hematochezia.  Denies any abdominal pain.  She denies any sick contacts.  Was recently on ciprofloxacin a few weeks ago.  Patient was started on empiric antibiotics and notes improvement in diarrhea.  She states she has not had a stool since last night, whereas she was previously having liquid stools approximately every 40 minutes.  She states she has had intermittent diarrhea throughout her life, particularly with food triggers, such as fatty food after her cholecystectomy, and she reports "food allergies."  Patient denies any nausea, vomiting, hematemesis.  She states she is established with GI at [facility], as well as a GI doctor in [place].  She states her last colonoscopy was approximately 5 years ago and was without abnormalities as far she is aware.  Past Medical History:  Diagnosis Date  . Arthritis   . High cholesterol   . Hypertension   . Osteoporosis     Past Surgical History:  Procedure Laterality Date  . ABDOMINAL HYSTERECTOMY    . CESAREAN SECTION    . CHOLECYSTECTOMY    . TONSILLECTOMY      Prior to Admission medications   Medication Sig Start Date End Date Taking? Authorizing Provider  aspirin-acetaminophen-caffeine (EXCEDRIN EXTRA STRENGTH) [phone] MG tablet Take 2 tablets by mouth every 6 (six) hours as needed for headache or migraine (pain).   Yes [provider]  chlorpheniramine (CHLOR-TRIMETON) 4 MG tablet Take 4 mg by mouth every 4 (four) hours. Pt takes every 4 hours due to sinus   Yes [provider]  cholecalciferol (VITAMIN D3) 25 MCG (1000 UNIT)  tablet Take 4,000 Units by mouth daily.   Yes [provider]  ezetimibe-simvastatin (VYTORIN) 10-20 MG tablet Take 1 tablet by mouth daily.   Yes [provider]  hydroxypropyl methylcellulose / hypromellose (ISOPTO TEARS / GONIOVISC) 2.5 % ophthalmic solution Place 1 drop into both eyes 3 (three) times daily as needed for dry eyes.   Yes [provider]  loperamide (IMODIUM) 2 MG capsule Take 4 mg by mouth as needed for diarrhea or loose stools.   Yes [provider]  montelukast (SINGULAIR) 10 MG tablet Take 10 mg by mouth at bedtime.   Yes [provider]  pseudoephedrine (SUDAFED) 60 MG tablet Take 60 mg by mouth every 4 (four) hours. Pt takes every 4 hours due to sinus problems   Yes [provider]  spironolactone (ALDACTONE) 25 MG tablet Take 25 mg by mouth See admin instructions. Pt takes daily but May take additional dose if bloated   Yes [provider]    Scheduled Meds: . enoxaparin (LOVENOX) injection  30 mg Subcutaneous Q24H  . ezetimibe  10 mg Oral Daily   And  . simvastatin  20 mg Oral Daily  . spironolactone  25 mg Oral Daily   Continuous Infusions: . ciprofloxacin 400 mg (07/20/20 0630)  . lactated ringers 100 mL/hr at 07/19/20 1716  . metronidazole 500 mg (07/20/20 0738)   PRN Meds:.acetaminophen **OR** acetaminophen, ondansetron **OR** ondansetron (ZOFRAN) IV, polyvinyl alcohol  Allergies as of 07/18/2020 - Review Complete 07/18/2020  Allergen Reaction Noted  . Demerol [meperidine  hcl]  07/18/2020  . Penicillins  07/18/2020    History reviewed. No pertinent family history.  Social History   Socioeconomic History  . Marital status: Married    Spouse name: Giada Schoppe  . Number of children: 2  . Years of education: Not on file  . Highest education level: Not on file  Occupational History  . Occupation: Retired  Tobacco Use  . Smoking status: Never Smoker  . Smokeless tobacco: Never Used   Vaping Use  . Vaping Use: Not on file  Substance and Sexual Activity  . Alcohol use: Never  . Drug use: Never  . Sexual activity: Not Currently    Partners: Male  Other Topics Concern  . Not on file  Social History Narrative  . Not on file   Social Determinants of Health   Financial Resource Strain: Not on file  Food Insecurity: Not on file  Transportation Needs: Not on file  Physical Activity: Not on file  Stress: Not on file  Social Connections: Not on file  Intimate Partner Violence: Not At Risk  . Fear of Current or Ex-Partner: No  . Emotionally Abused: No  . Physically Abused: No  . Sexually Abused: No    Review of Systems: Review of Systems  Constitutional: Negative for chills and fever.  HENT: Negative for hearing loss and tinnitus.   Eyes: Negative for pain and redness.  Respiratory: Negative for cough and shortness of breath.   Cardiovascular: Negative for chest pain and palpitations.  Gastrointestinal: Positive for diarrhea. Negative for abdominal pain, blood in stool, constipation, heartburn, melena, nausea and vomiting.  Skin: Negative for itching and rash.  Neurological: Negative for seizures and loss of consciousness.  Endo/Heme/Allergies: Negative for polydipsia. Does not bruise/bleed easily.  Psychiatric/Behavioral: Negative for substance abuse. The patient is not nervous/anxious.     Physical Exam: Vital signs: Vitals:   07/19/20 2122 07/20/20 0503  BP: (!) 117/47 (!) 125/58  Pulse: 78 74  Resp: 16 14  Temp: 98.2 F (36.8 C) 97.9 F (36.6 C)  SpO2: 97% 97%   Last BM Date: 07/19/20  Physical Exam Vitals reviewed.  Constitutional:      General: She is not in acute distress. HENT:     Head: Normocephalic and atraumatic.     Nose: Nose normal. No congestion.     Mouth/Throat:     Mouth: Mucous membranes are moist.     Pharynx: Oropharynx is clear.  Eyes:     General: No scleral icterus.    Extraocular Movements: Extraocular movements  intact.     Conjunctiva/sclera: Conjunctivae normal.  Cardiovascular:     Rate and Rhythm: Normal rate and regular rhythm.     Pulses: Normal pulses.  Pulmonary:     Effort: Pulmonary effort is normal. No respiratory distress.     Breath sounds: Normal breath sounds.  Abdominal:     General: Bowel sounds are normal. There is no distension.     Palpations: Abdomen is soft. There is no mass.     Tenderness: There is no abdominal tenderness. There is no guarding or rebound.     Hernia: No hernia is present.  Musculoskeletal:        General: No swelling or tenderness.     Cervical back: Normal range of motion and neck supple.  Skin:    General: Skin is warm and dry.  Neurological:     General: No focal deficit present.     Mental Status: She is alert  and oriented to person, place, and time.  Psychiatric:        Mood and Affect: Mood normal.        Behavior: Behavior normal. Behavior is cooperative.     GI:  Lab Results: Recent Labs    07/18/20 1839 07/19/20 0824 07/20/20 0628  WBC 21.4* 9.7 6.2  HGB 11.7* 9.7* 8.0*  HCT 40.0 33.3* 27.4*  PLT 576* 475* 373   BMET Recent Labs    07/18/20 1839 07/19/20 0824 07/20/20 0628  NA 135  --  138  K 4.1  --  3.3*  CL 97*  --  103  CO2 25  --  25  GLUCOSE 155*  --  77  BUN 24*  --  11  CREATININE 0.80 0.82 0.61  CALCIUM 9.3  --  8.3*   LFT Recent Labs    07/18/20 1839  PROT 6.7  ALBUMIN 3.6  AST 22  ALT 12  ALKPHOS 81  BILITOT 0.7   PT/INR No results for input(s): LABPROT, INR in the last 72 hours.   Studies/Results: CT Abdomen Pelvis W Contrast  Result Date: 07/18/2020 CLINICAL DATA:  Abdomen pain EXAM: CT ABDOMEN AND PELVIS WITH CONTRAST TECHNIQUE: Multidetector CT imaging of the abdomen and pelvis was performed using the standard protocol following bolus administration of intravenous contrast. CONTRAST:  54mL OMNIPAQUE IOHEXOL 300 MG/ML  SOLN COMPARISON:  None. FINDINGS: Lower chest: Lung bases demonstrate no  acute consolidation or effusion. Normal cardiac size. Hepatobiliary: No focal hepatic abnormality. Status post cholecystectomy. Moderate intra and extrahepatic biliary dilatation, extrahepatic common bile duct measures up to 13 mm. No calcified stones. Pancreas: Unremarkable. No pancreatic ductal dilatation or surrounding inflammatory changes. Spleen: Normal in size without focal abnormality. Adrenals/Urinary Tract: Adrenal glands are normal. Low-density lesions within the bilateral kidneys consistent with cysts. The bladder is unremarkable. Stomach/Bowel: The stomach is nonenlarged. No dilated small bowel. Appendix not well seen but no right lower quadrant inflammatory process. Fluid throughout the colon with mild diffuse wall thickening. No intramural air. Vascular/Lymphatic: Advanced aortic atherosclerosis. No aneurysm. Portal vessels appear patent. No suspicious nodes. Reproductive: Status post hysterectomy. No adnexal masses. Other: Negative for free air or free fluid. Musculoskeletal: Age indeterminate superior endplate deformities at L1, L2 and L3. IMPRESSION: 1. Fluid throughout the colon with mild diffuse wall thickening, consistent with a pancolitis of infectious or inflammatory etiology, less likely ischemic given diffuse involvement. Consider C difficile in the appropriate clinical setting. 2. Moderate intra and extrahepatic biliary dilatation status post cholecystectomy. Recommend correlation with LFTs with possible MR or ERCP follow-up. 3. Age indeterminate superior endplate deformities at L1, L2 and L3. 4. Aortic atherosclerosis. Aortic Atherosclerosis (ICD10-I70.0). Electronically Signed   By: Jasmine Pang M.D.   On: 07/18/2020 22:03    Impression: Diarrhea, possibly infectious versus inflammatory.  Improving on empiric antibiotics.  Anemia: Hgb 8.0 today, decreased from 9.7 yesterday and 11.7 two days ago Decrease in Hgb, possibly dilutional, 1.7g drop is concerning for occult blood  loss.  Plan:  Continue Cipro/Flagyl for an additional 10-14 days.  Would recommend observation today with repeat Hgb in the morning.  If Hgb stable or improved, patient should be able to be discharged tomorrow.  Advised patient to follow-up with her GI doctor for consideration of colonoscopy as an outpatient.  [facility] will follow.   LOS: 1 day   Edrick Kins  PA-C 07/20/2020, 10:42 AM  Contact #  [phone]

## 2020-07-21 DIAGNOSIS — A419 Sepsis, unspecified organism: Secondary | ICD-10-CM | POA: Diagnosis not present

## 2020-07-21 DIAGNOSIS — K529 Noninfective gastroenteritis and colitis, unspecified: Secondary | ICD-10-CM | POA: Diagnosis not present

## 2020-07-21 DIAGNOSIS — E860 Dehydration: Secondary | ICD-10-CM

## 2020-07-21 DIAGNOSIS — I10 Essential (primary) hypertension: Secondary | ICD-10-CM | POA: Diagnosis not present

## 2020-07-21 LAB — CBC
HCT: 27.8 % — ABNORMAL LOW (ref 36.0–46.0)
Hemoglobin: 8.3 g/dL — ABNORMAL LOW (ref 12.0–15.0)
MCH: 24.1 pg — ABNORMAL LOW (ref 26.0–34.0)
MCHC: 29.9 g/dL — ABNORMAL LOW (ref 30.0–36.0)
MCV: 80.8 fL (ref 80.0–100.0)
Platelets: 396 10*3/uL (ref 150–400)
RBC: 3.44 MIL/uL — ABNORMAL LOW (ref 3.87–5.11)
RDW: 16.7 % — ABNORMAL HIGH (ref 11.5–15.5)
WBC: 5.9 10*3/uL (ref 4.0–10.5)
nRBC: 0 % (ref 0.0–0.2)

## 2020-07-21 LAB — BASIC METABOLIC PANEL
Anion gap: 5 (ref 5–15)
BUN: <5 mg/dL — ABNORMAL LOW (ref 8–23)
CO2: 31 mmol/L (ref 22–32)
Calcium: 8.5 mg/dL — ABNORMAL LOW (ref 8.9–10.3)
Chloride: 103 mmol/L (ref 98–111)
Creatinine, Ser: 0.61 mg/dL (ref 0.44–1.00)
GFR, Estimated: >60 mL/min (ref >60)
Glucose, Bld: 122 mg/dL — ABNORMAL HIGH (ref 70–99)
Potassium: 3.4 mmol/L — ABNORMAL LOW (ref 3.5–5.1)
Sodium: 139 mmol/L (ref 135–145)

## 2020-07-21 MED ORDER — CIPROFLOXACIN HCL 500 MG PO TABS: 500.0000 mg | ORAL_TABLET | Freq: Two times a day (BID) | ORAL | 0 refills | Status: AC

## 2020-07-21 MED ORDER — POTASSIUM CHLORIDE CRYS ER 20 MEQ PO TBCR
40.0000 meq | EXTENDED_RELEASE_TABLET | Freq: Once | ORAL | Status: AC
  Administered 2020-07-21: 40 meq via ORAL
  Filled 2020-07-21: qty 2

## 2020-07-21 MED ORDER — METRONIDAZOLE 500 MG PO TABS: 500.0000 mg | ORAL_TABLET | Freq: Three times a day (TID) | ORAL | 0 refills | Status: AC

## 2020-07-21 NOTE — Discharge Summary (Signed)
Physician Discharge Summary  Carol Baxter NFA:[id number] DOB: [birth year] DOA: 07/18/2020  PCP: System, Provider Not In  Admit date: 07/18/2020 Discharge date: 07/21/2020  Time spent: 50* minutes  Recommendations for Outpatient Follow-up:  1. Follow-up PCP in 2 weeks 2. Follow-up gastroenterology in 1 to 2 weeks.  Call to make an appointment   Discharge Diagnoses:  Principal Problem:   Colitis Active Problems:   Dehydration   Essential hypertension   Leukocytosis   Sepsis (HCC)   Discharge Condition: Stable  Diet recommendation: Heart healthy diet  Filed Weights   07/18/20 1820 07/19/20 0255  Weight: 45.4 kg 41 kg    History of present illness:  80 year old female with history of hypertension, rheumatoid arthritis presents with diarrhea. CT abdomen pelvis showed diffuse wall thickening of the colon consistent with pancolitis. Patient was started on p.o. vancomycin empirically however stool for C. difficile was negative. P.o. vancomycin was  discontinued.     Hospital Course:  1. Colitis-C. difficile PCR was negative for antigen and toxin.  P.o. vancomycin was initially started which was discontinued.  CT scan showed diffuse pancolitis infectious versus inflammatory so [facility] was consulted.  GI recommended continuing with ciprofloxacin and Flagyl.  At this time diarrhea has significantly improved.  Plan is to go home with Cipro and Flagyl. 2. Anemia-patient's hemoglobin dropped from 11.7 on the day of admission to 8.0, likely dilutional.  No overt GI bleed.    Repeat hemoglobin this morning is 8.3.  Follow-up gastroenterology as outpatient for further work-up. 3. Hypertension-continue Aldactone. 4. Sepsis-sepsis physiology has resolved, patient presented with hypotension, leukocytosis, tachycardia, elevated lactic acid level.  Likely from colitis as above. 5. Hypokalemia-potassium is 3.4, will give 1 dose of K. Dur 40 mg p.o. x1 before  discharge.   Procedures:    Consultations:  Gastroenterology  Discharge Exam: Vitals:   07/20/20 2036 07/21/20 0534  BP: 116/66 118/63  Pulse: 78 74  Resp: 18 15  Temp: 97.7 F (36.5 C) 98.1 F (36.7 C)  SpO2: 100% 97%    General: Appears in no acute distress Cardiovascular: S1-S2, regular Respiratory: Clear to auscultation bilaterally,  Discharge Instructions   Discharge Instructions    Diet - low sodium heart healthy   Complete by: As directed    Increase activity slowly   Complete by: As directed      Allergies as of 07/21/2020      Reactions   Demerol [meperidine Hcl] Nausea And Vomiting   Pentothal [thiopental] Nausea And Vomiting   Penicillins Swelling, Rash      Medication List    TAKE these medications   chlorpheniramine 4 MG tablet Commonly known as: CHLOR-TRIMETON Take 4 mg by mouth every 4 (four) hours. Pt takes every 4 hours due to sinus   cholecalciferol 25 MCG (1000 UNIT) tablet Commonly known as: VITAMIN D3 Take 4,000 Units by mouth daily.   ciprofloxacin 500 MG tablet Commonly known as: Cipro Take 1 tablet (500 mg total) by mouth 2 (two) times daily for 5 days.   Excedrin Extra Strength 250-250-65 MG tablet Generic drug: aspirin-acetaminophen-caffeine Take 2 tablets by mouth every 6 (six) hours as needed for headache or migraine (pain).   ezetimibe-simvastatin 10-20 MG tablet Commonly known as: VYTORIN Take 1 tablet by mouth daily.   hydroxypropyl methylcellulose / hypromellose 2.5 % ophthalmic solution Commonly known as: ISOPTO TEARS / GONIOVISC Place 1 drop into both eyes 3 (three) times daily as needed for dry eyes.   loperamide 2 MG capsule Commonly  known as: IMODIUM Take 4 mg by mouth as needed for diarrhea or loose stools.   metroNIDAZOLE 500 MG tablet Commonly known as: Flagyl Take 1 tablet (500 mg total) by mouth 3 (three) times daily for 5 days.   montelukast 10 MG tablet Commonly known as: SINGULAIR Take 10 mg by  mouth at bedtime.   pseudoephedrine 60 MG tablet Commonly known as: SUDAFED Take 60 mg by mouth every 4 (four) hours. Pt takes every 4 hours due to sinus problems   spironolactone 25 MG tablet Commonly known as: ALDACTONE Take 25 mg by mouth See admin instructions. Pt takes daily but May take additional dose if bloated      Allergies  Allergen Reactions  . Demerol [Meperidine Hcl] Nausea And Vomiting  . Pentothal [Thiopental] Nausea And Vomiting  . Penicillins Swelling and Rash      The results of significant diagnostics from this hospitalization (including imaging, microbiology, ancillary and laboratory) are listed below for reference.    Significant Diagnostic Studies: CT Abdomen Pelvis W Contrast  Result Date: 07/18/2020 CLINICAL DATA:  Abdomen pain EXAM: CT ABDOMEN AND PELVIS WITH CONTRAST TECHNIQUE: Multidetector CT imaging of the abdomen and pelvis was performed using the standard protocol following bolus administration of intravenous contrast. CONTRAST:  77mL OMNIPAQUE IOHEXOL 300 MG/ML  SOLN COMPARISON:  None. FINDINGS: Lower chest: Lung bases demonstrate no acute consolidation or effusion. Normal cardiac size. Hepatobiliary: No focal hepatic abnormality. Status post cholecystectomy. Moderate intra and extrahepatic biliary dilatation, extrahepatic common bile duct measures up to 13 mm. No calcified stones. Pancreas: Unremarkable. No pancreatic ductal dilatation or surrounding inflammatory changes. Spleen: Normal in size without focal abnormality. Adrenals/Urinary Tract: Adrenal glands are normal. Low-density lesions within the bilateral kidneys consistent with cysts. The bladder is unremarkable. Stomach/Bowel: The stomach is nonenlarged. No dilated small bowel. Appendix not well seen but no right lower quadrant inflammatory process. Fluid throughout the colon with mild diffuse wall thickening. No intramural air. Vascular/Lymphatic: Advanced aortic atherosclerosis. No aneurysm. Portal  vessels appear patent. No suspicious nodes. Reproductive: Status post hysterectomy. No adnexal masses. Other: Negative for free air or free fluid. Musculoskeletal: Age indeterminate superior endplate deformities at L1, L2 and L3. IMPRESSION: 1. Fluid throughout the colon with mild diffuse wall thickening, consistent with a pancolitis of infectious or inflammatory etiology, less likely ischemic given diffuse involvement. Consider C difficile in the appropriate clinical setting. 2. Moderate intra and extrahepatic biliary dilatation status post cholecystectomy. Recommend correlation with LFTs with possible MR or ERCP follow-up. 3. Age indeterminate superior endplate deformities at L1, L2 and L3. 4. Aortic atherosclerosis. Aortic Atherosclerosis (ICD10-I70.0). Electronically Signed   By: Jasmine Pang M.D.   On: 07/18/2020 22:03    Microbiology: Recent Results (from the past 240 hour(s))  Resp Panel by RT-PCR (Flu A&B, Covid) Nasopharyngeal Swab     Status: None   Collection Time: 07/18/20 11:01 PM   Specimen: Nasopharyngeal Swab; Nasopharyngeal(NP) swabs in vial transport medium  Result Value Ref Range Status   SARS Coronavirus 2 by RT PCR NEGATIVE NEGATIVE Final    Comment: (NOTE) SARS-CoV-2 target nucleic acids are NOT DETECTED.  The SARS-CoV-2 RNA is generally detectable in upper respiratory specimens during the acute phase of infection. The lowest concentration of SARS-CoV-2 viral copies this assay can detect is 138 copies/mL. A negative result does not preclude SARS-Cov-2 infection and should not be used as the sole basis for treatment or other patient management decisions. A negative result may occur with  improper specimen collection/handling,  submission of specimen other than nasopharyngeal swab, presence of viral mutation(s) within the areas targeted by this assay, and inadequate number of viral copies(<138 copies/mL). A negative result must be combined with clinical observations,  patient history, and epidemiological information. The expected result is Negative.  Fact Sheet for Patients:  BloggerCourse.com  Fact Sheet for Healthcare Providers:  SeriousBroker.it  This test is no t yet approved or cleared by the [place] FDA and  has been authorized for detection and/or diagnosis of SARS-CoV-2 by FDA under an Emergency Use Authorization (EUA). This EUA will remain  in effect (meaning this test can be used) for the duration of the COVID-19 declaration under Section 564(b)(1) of the Act, 21 U.S.C.section 360bbb-3(b)(1), unless the authorization is terminated  or revoked sooner.       Influenza A by PCR NEGATIVE NEGATIVE Final   Influenza B by PCR NEGATIVE NEGATIVE Final    Comment: (NOTE) The Xpert Xpress SARS-CoV-2/FLU/RSV plus assay is intended as an aid in the diagnosis of influenza from Nasopharyngeal swab specimens and should not be used as a sole basis for treatment. Nasal washings and aspirates are unacceptable for Xpert Xpress SARS-CoV-2/FLU/RSV testing.  Fact Sheet for Patients: BloggerCourse.com  Fact Sheet for Healthcare Providers: SeriousBroker.it  This test is not yet approved or cleared by the [place] FDA and has been authorized for detection and/or diagnosis of SARS-CoV-2 by FDA under an Emergency Use Authorization (EUA). This EUA will remain in effect (meaning this test can be used) for the duration of the COVID-19 declaration under Section 564(b)(1) of the Act, 21 U.S.C. section 360bbb-3(b)(1), unless the authorization is terminated or revoked.  Performed at [facility], [address] [place], [place] [address]   C Difficile Quick Screen w PCR reflex     Status: None   Collection Time: 07/19/20  5:53 AM   Specimen: STOOL  Result Value Ref Range Status   C Diff antigen NEGATIVE NEGATIVE Final   C Diff toxin  NEGATIVE NEGATIVE Final   C Diff interpretation No C. difficile detected.  Final    Comment: Performed at [facility], [address], [place], [place] [address]  Gastrointestinal Panel by PCR , Stool     Status: None   Collection Time: 07/19/20  5:53 AM   Specimen: STOOL  Result Value Ref Range Status   Campylobacter species NOT DETECTED NOT DETECTED Final   Plesimonas shigelloides NOT DETECTED NOT DETECTED Final   Salmonella species NOT DETECTED NOT DETECTED Final   Yersinia enterocolitica NOT DETECTED NOT DETECTED Final   Vibrio species NOT DETECTED NOT DETECTED Final   Vibrio cholerae NOT DETECTED NOT DETECTED Final   Enteroaggregative E coli (EAEC) NOT DETECTED NOT DETECTED Final   Enteropathogenic E coli (EPEC) NOT DETECTED NOT DETECTED Final   Enterotoxigenic E coli (ETEC) NOT DETECTED NOT DETECTED Final   Shiga like toxin producing E coli (STEC) NOT DETECTED NOT DETECTED Final   Shigella/Enteroinvasive E coli (EIEC) NOT DETECTED NOT DETECTED Final   Cryptosporidium NOT DETECTED NOT DETECTED Final   Cyclospora cayetanensis NOT DETECTED NOT DETECTED Final   Entamoeba histolytica NOT DETECTED NOT DETECTED Final   Giardia lamblia NOT DETECTED NOT DETECTED Final   Adenovirus F40/41 NOT DETECTED NOT DETECTED Final   Astrovirus NOT DETECTED NOT DETECTED Final   Norovirus GI/GII NOT DETECTED NOT DETECTED Final   Rotavirus A NOT DETECTED NOT DETECTED Final   Sapovirus (I, II, IV, and V) NOT DETECTED NOT DETECTED  Final    Comment: Performed at [facility], [address] [place], [place] [address]     Labs: Basic Metabolic Panel: Recent Labs  Lab 07/18/20 1839 07/19/20 0824 07/20/20 0628 07/21/20 0655  NA 135  --  138 139  K 4.1  --  3.3* 3.4*  CL 97*  --  103 103  CO2 25  --  25 31  GLUCOSE 155*  --  77 122*  BUN 24*  --  11 <5*  CREATININE 0.80 0.82 0.61 0.61  CALCIUM 9.3  --  8.3* 8.5*   Liver Function Tests: Recent Labs  Lab  07/18/20 1839  AST 22  ALT 12  ALKPHOS 81  BILITOT 0.7  PROT 6.7  ALBUMIN 3.6   Recent Labs  Lab 07/18/20 1839  LIPASE 19   No results for input(s): AMMONIA in the last 168 hours. CBC: Recent Labs  Lab 07/18/20 1839 07/19/20 0824 07/20/20 0628 07/21/20 0655  WBC 21.4* 9.7 6.2 5.9  HGB 11.7* 9.7* 8.0* 8.3*  HCT 40.0 33.3* 27.4* 27.8*  MCV 81.8 81.2 80.8 80.8  PLT 576* 475* 373 396       Signed:  Meredeth Ide MD.  [facility] 07/21/2020, 12:11 PM

## 2021-05-31 ENCOUNTER — Emergency Department (HOSPITAL_BASED_OUTPATIENT_CLINIC_OR_DEPARTMENT_OTHER)
Admission: EM | Admit: 2021-05-31 | Discharge: 2021-05-31 | Disposition: A | Discharge status: Home / Self Care | Payer: Federal, State, Local not specified - PPO | Attending: Emergency Medicine | Admitting: Emergency Medicine

## 2021-05-31 ENCOUNTER — Emergency Department (HOSPITAL_BASED_OUTPATIENT_CLINIC_OR_DEPARTMENT_OTHER): Payer: Federal, State, Local not specified - PPO

## 2021-05-31 ENCOUNTER — Other Ambulatory Visit: Payer: Self-pay

## 2021-05-31 ENCOUNTER — Encounter (HOSPITAL_BASED_OUTPATIENT_CLINIC_OR_DEPARTMENT_OTHER): Payer: Self-pay

## 2021-05-31 DIAGNOSIS — Z79899 Other long term (current) drug therapy: Secondary | ICD-10-CM | POA: Diagnosis not present

## 2021-05-31 DIAGNOSIS — R1084 Generalized abdominal pain: Secondary | ICD-10-CM

## 2021-05-31 DIAGNOSIS — I10 Essential (primary) hypertension: Secondary | ICD-10-CM | POA: Diagnosis not present

## 2021-05-31 DIAGNOSIS — R197 Diarrhea, unspecified: Secondary | ICD-10-CM

## 2021-05-31 LAB — CBC WITH DIFFERENTIAL/PLATELET
Abs Immature Granulocytes: 0.02 10*3/uL (ref 0.00–0.07)
Basophils Absolute: 0.1 10*3/uL (ref 0.0–0.1)
Basophils Relative: 1 %
Eosinophils Absolute: 0.2 10*3/uL (ref 0.0–0.5)
Eosinophils Relative: 3 %
HCT: 40.4 % (ref 36.0–46.0)
Hemoglobin: 12.2 g/dL (ref 12.0–15.0)
Immature Granulocytes: 0 %
Lymphocytes Relative: 13 %
Lymphs Abs: 1.1 10*3/uL (ref 0.7–4.0)
MCH: 26.3 pg (ref 26.0–34.0)
MCHC: 30.2 g/dL (ref 30.0–36.0)
MCV: 87.1 fL (ref 80.0–100.0)
Monocytes Absolute: 0.9 10*3/uL (ref 0.1–1.0)
Monocytes Relative: 11 %
Neutro Abs: 6.1 10*3/uL (ref 1.7–7.7)
Neutrophils Relative %: 72 %
Platelets: 443 10*3/uL — ABNORMAL HIGH (ref 150–400)
RBC: 4.64 MIL/uL (ref 3.87–5.11)
RDW: 16 % — ABNORMAL HIGH (ref 11.5–15.5)
WBC: 8.3 10*3/uL (ref 4.0–10.5)
nRBC: 0 % (ref 0.0–0.2)

## 2021-05-31 LAB — COMPREHENSIVE METABOLIC PANEL
ALT: 14 U/L (ref 0–44)
AST: 21 U/L (ref 15–41)
Albumin: 3.8 g/dL (ref 3.5–5.0)
Alkaline Phosphatase: 65 U/L (ref 38–126)
Anion gap: 8 (ref 5–15)
BUN: 17 mg/dL (ref 8–23)
CO2: 31 mmol/L (ref 22–32)
Calcium: 9.4 mg/dL (ref 8.9–10.3)
Chloride: 99 mmol/L (ref 98–111)
Creatinine, Ser: 0.73 mg/dL (ref 0.44–1.00)
GFR, Estimated: >60 mL/min (ref >60)
Glucose, Bld: 91 mg/dL (ref 70–99)
Potassium: 3.7 mmol/L (ref 3.5–5.1)
Sodium: 138 mmol/L (ref 135–145)
Total Bilirubin: 0.4 mg/dL (ref 0.3–1.2)
Total Protein: 6.5 g/dL (ref 6.5–8.1)

## 2021-05-31 LAB — OCCULT BLOOD X 1 CARD TO LAB, STOOL: Fecal Occult Bld: POSITIVE — AB

## 2021-05-31 LAB — LIPASE, BLOOD: Lipase: 24 U/L (ref 11–51)

## 2021-05-31 MED ORDER — SODIUM CHLORIDE 0.9 % IV SOLN: INTRAVENOUS | Status: DC

## 2021-05-31 MED ORDER — IOHEXOL 300 MG/ML  SOLN
100.0000 mL | Freq: Once | INTRAMUSCULAR | Status: AC | PRN
  Administered 2021-05-31: 100 mL via INTRAVENOUS

## 2021-05-31 NOTE — Discharge Instructions (Addendum)
Stool was slightly heme positive.  Follow-up with your regular doctor for recheck.  For the diarrhea you can take over-the-counter Imodium and/or Pepto-Bismol per your choice.  Return for any new or worse symptoms.  CT scan showed no evidence of the colitis or enteritis that she had before and no evidence of diverticulitis.  Your blood counts were normal today your hemoglobin was 12.  But close follow-up with your primary care doctor to have that rechecked would be important.

## 2021-05-31 NOTE — ED Provider Notes (Signed)
[facility] EMERGENCY DEPARTMENT Provider Note   CSN: [id number] Arrival date & time: 05/31/21  1051     History Chief Complaint  Patient presents with   Abdominal Pain    Carol Baxter is a 80 y.o. female.  Patient with onset of diarrheal illness starting on Sunday.  Took 3 days of Cipro.  Seem to improve.  But then last night started back.  Patient states stool have been black.  She went to something similar in January of this year was admitted for colitis dehydration treated with Cipro and Flagyl.  Then had to continue the antibiotics for longer.  But eventually resolved.  Patient denies Pepto-Bismol.  Denies iron.  States his stools were black at that time to but they were heme-negative.  So they thought it was a bacteria that was causing the black color.  1 episode of vomiting.  Patient states she is eating and drinking fairly well feels that she had some cheese last night that may have aggravated it.  Symptoms associated with some diffuse abdominal pain.      Past Medical History:  Diagnosis Date   Arthritis    High cholesterol    Hypertension    Osteoporosis     Patient Active Problem List   Diagnosis Date Noted   Dehydration 07/19/2020   Essential hypertension 07/19/2020   Leukocytosis 07/19/2020   Sepsis (HCC) 07/19/2020   Colitis 07/18/2020    Past Surgical History:  Procedure Laterality Date   ABDOMINAL HYSTERECTOMY     CESAREAN SECTION     CHOLECYSTECTOMY     TONSILLECTOMY       OB History   No obstetric history on file.     No family history on file.  Social History   Tobacco Use   Smoking status: Never   Smokeless tobacco: Never  Vaping Use   Vaping Use: Never used  Substance Use Topics   Alcohol use: Never   Drug use: Never    Home Medications Prior to Admission medications   Medication Sig Start Date End Date Taking? Authorizing Provider  aspirin-acetaminophen-caffeine (EXCEDRIN EXTRA STRENGTH) [phone] MG tablet Take 2  tablets by mouth every 6 (six) hours as needed for headache or migraine (pain).    [provider]  chlorpheniramine (CHLOR-TRIMETON) 4 MG tablet Take 4 mg by mouth every 4 (four) hours. Pt takes every 4 hours due to sinus    [provider]  cholecalciferol (VITAMIN D3) 25 MCG (1000 UNIT) tablet Take 4,000 Units by mouth daily.    [provider]  ezetimibe-simvastatin (VYTORIN) 10-20 MG tablet Take 1 tablet by mouth daily.    [provider]  hydroxypropyl methylcellulose / hypromellose (ISOPTO TEARS / GONIOVISC) 2.5 % ophthalmic solution Place 1 drop into both eyes 3 (three) times daily as needed for dry eyes.    [provider]  loperamide (IMODIUM) 2 MG capsule Take 4 mg by mouth as needed for diarrhea or loose stools.    [provider]  montelukast (SINGULAIR) 10 MG tablet Take 10 mg by mouth at bedtime.    [provider]  pseudoephedrine (SUDAFED) 60 MG tablet Take 60 mg by mouth every 4 (four) hours. Pt takes every 4 hours due to sinus problems    [provider]  spironolactone (ALDACTONE) 25 MG tablet Take 25 mg by mouth See admin instructions. Pt takes daily but May take additional dose if bloated    [provider]    Allergies  Demerol [meperidine hcl], Pentothal [thiopental], and Penicillins  Review of Systems   Review of Systems  Constitutional:  Negative for chills and fever.  HENT:  Negative for ear pain and sore throat.   Eyes:  Negative for pain and visual disturbance.  Respiratory:  Negative for cough and shortness of breath.   Cardiovascular:  Negative for chest pain and palpitations.  Gastrointestinal:  Positive for abdominal pain, diarrhea and vomiting.  Genitourinary:  Negative for dysuria and hematuria.  Musculoskeletal:  Negative for arthralgias and back pain.  Skin:  Negative for color change and rash.  Neurological:  Negative for seizures and syncope.  All other systems reviewed  and are negative.  Physical Exam Updated Vital Signs BP 101/87 (BP Location: Left Arm)   Pulse 68   Temp 97.7 F (36.5 C) (Oral)   Resp 16   Ht 1.549 m (5\' 1" )   Wt 43.1 kg   SpO2 98%   BMI 17.95 kg/m   Physical Exam Vitals and nursing note reviewed.  Constitutional:      General: She is not in acute distress.    Appearance: Normal appearance. She is well-developed.  HENT:     Head: Normocephalic and atraumatic.  Eyes:     Extraocular Movements: Extraocular movements intact.     Conjunctiva/sclera: Conjunctivae normal.     Pupils: Pupils are equal, round, and reactive to light.  Cardiovascular:     Rate and Rhythm: Normal rate and regular rhythm.     Heart sounds: No murmur heard. Pulmonary:     Effort: Pulmonary effort is normal. No respiratory distress.     Breath sounds: Normal breath sounds.  Abdominal:     General: There is no distension.     Palpations: Abdomen is soft.     Tenderness: There is no abdominal tenderness. There is no guarding.  Musculoskeletal:        General: No swelling. Normal range of motion.     Cervical back: Normal range of motion and neck supple.  Skin:    General: Skin is warm and dry.     Capillary Refill: Capillary refill takes less than 2 seconds.  Neurological:     General: No focal deficit present.     Mental Status: She is alert and oriented to person, place, and time.  Psychiatric:        Mood and Affect: Mood normal.    ED Results / Procedures / Treatments   Labs (all labs ordered are listed, but only abnormal results are displayed) Labs Reviewed  CBC WITH DIFFERENTIAL/PLATELET - Abnormal; Notable for the following components:      Result Value   RDW 16.0 (*)    Platelets 443 (*)    All other components within normal limits  OCCULT BLOOD X 1 CARD TO LAB, STOOL - Abnormal; Notable for the following components:   Fecal Occult Bld POSITIVE (*)    All other components within normal limits  COMPREHENSIVE METABOLIC PANEL   LIPASE, BLOOD  POC OCCULT BLOOD, ED    EKG None  Radiology CT Abdomen Pelvis W Contrast  Result Date: 05/31/2021 CLINICAL DATA:  Diverticulitis suspected EXAM: CT ABDOMEN AND PELVIS WITH CONTRAST TECHNIQUE: Multidetector CT imaging of the abdomen and pelvis was performed using the standard protocol following bolus administration of intravenous contrast. CONTRAST:  169mL OMNIPAQUE IOHEXOL 300 MG/ML  SOLN COMPARISON:  CT abdomen and pelvis dated July 18, 2020 FINDINGS: Lower chest: No acute abnormality. Hepatobiliary: No focal liver lesions. Prior cholecystectomy  with moderate intra and extrahepatic biliary ductal dilation which is unchanged when compared with prior exam. Pancreas: Unremarkable. No pancreatic ductal dilatation or surrounding inflammatory changes. Spleen: Normal in size without focal abnormality. Adrenals/Urinary Tract: Bilateral adrenal glands are unremarkable. Kidneys enhance symmetrically with no evidence hydronephrosis or nephrolithiasis. Bilateral low-attenuation renal lesions which are likely simple cysts. Stomach/Bowel: Stomach is within normal limits. Appendix appears normal. No evidence of bowel wall thickening, distention, or inflammatory changes. Vascular/Lymphatic: Aortic atherosclerosis. No enlarged abdominal or pelvic lymph nodes. Reproductive: Bilateral adnexa are unremarkable. Other: No abdominal wall hernia or abnormality. No abdominopelvic ascites. Musculoskeletal: Unchanged L1 and L2 compression deformities. 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 No aggressive appearing osseous lesions. IMPRESSION: 1. No acute findings in the abdomen or pelvis. 2. Prior cholecystectomy with moderate intra and extrahepatic biliary ductal dilation which is unchanged when compared with prior exam. 3.  Aortic Atherosclerosis (ICD10-I70.0). Electronically Signed   By: Allegra Lai M.D.   On: 05/31/2021 13:41    Procedures Procedures   Medications Ordered in  ED Medications  iohexol (OMNIPAQUE) 300 MG/ML solution 100 mL (100 mLs Intravenous Contrast Given 05/31/21 1302)    ED Course  I have reviewed the triage vital signs and the nursing notes.  Pertinent labs & imaging results that were available during my care of the patient were reviewed by me and considered in my medical decision making (see chart for details).    MDM Rules/Calculators/A&P                           Abdomen soft and nontender.  No clinical evidence of any significant dehydration.  Vital signs reassuring temp is 97.  Respirations 14 heart rate 68.  Blood pressure 139/65.  Oxygen saturation 100% on room air.  Will get labs.  CT scan abdomen.  And check Hemoccult.  CT abdomen without any acute findings.  No evidence of any diverticulitis or colitis or enteritis.  Patient's hemoglobin is 12 today.  Stool was actually light brown.  Was not black was not bloody.  But it is Hemoccult positive.  Can have patient follow-up with her doctors regarding that.  Patient stable for discharge home.  Would recommend Imodium as needed for the diarrhea.  Do not feel the patient needs antibiotics   Final Clinical Impression(s) / ED Diagnoses Final diagnoses:  Generalized abdominal pain  Diarrhea, unspecified type    Rx / DC Orders ED Discharge Orders     None        Vanetta Mulders, MD 06/01/21 [phone]

## 2021-05-31 NOTE — ED Triage Notes (Signed)
Pt states that she was recently admitted for a bacterial infection and diverticulitis, reports she started having pain in her abdomen again with 'black liquid diarrhea' denies taking pepto, did take tums and also reports taking a few days of her husbands Cipro with no change in symptoms. Pt has also had some NVD.

## 2021-09-24 ENCOUNTER — Emergency Department (HOSPITAL_BASED_OUTPATIENT_CLINIC_OR_DEPARTMENT_OTHER)
Admission: EM | Admit: 2021-09-24 | Discharge: 2021-09-24 | Disposition: A | Discharge status: Home / Self Care | Payer: Federal, State, Local not specified - PPO | Attending: Emergency Medicine | Admitting: Emergency Medicine

## 2021-09-24 ENCOUNTER — Emergency Department (HOSPITAL_BASED_OUTPATIENT_CLINIC_OR_DEPARTMENT_OTHER): Payer: Federal, State, Local not specified - PPO

## 2021-09-24 ENCOUNTER — Other Ambulatory Visit: Payer: Self-pay

## 2021-09-24 ENCOUNTER — Encounter (HOSPITAL_BASED_OUTPATIENT_CLINIC_OR_DEPARTMENT_OTHER): Payer: Self-pay | Admitting: Emergency Medicine

## 2021-09-24 DIAGNOSIS — D72829 Elevated white blood cell count, unspecified: Secondary | ICD-10-CM | POA: Diagnosis not present

## 2021-09-24 DIAGNOSIS — I10 Essential (primary) hypertension: Secondary | ICD-10-CM | POA: Insufficient documentation

## 2021-09-24 DIAGNOSIS — R42 Dizziness and giddiness: Secondary | ICD-10-CM | POA: Insufficient documentation

## 2021-09-24 DIAGNOSIS — I951 Orthostatic hypotension: Secondary | ICD-10-CM | POA: Insufficient documentation

## 2021-09-24 DIAGNOSIS — Z20822 Contact with and (suspected) exposure to covid-19: Secondary | ICD-10-CM | POA: Diagnosis not present

## 2021-09-24 DIAGNOSIS — R0789 Other chest pain: Secondary | ICD-10-CM | POA: Diagnosis present

## 2021-09-24 LAB — CBC
HCT: 42.1 % (ref 36.0–46.0)
Hemoglobin: 13.1 g/dL (ref 12.0–15.0)
MCH: 27 pg (ref 26.0–34.0)
MCHC: 31.1 g/dL (ref 30.0–36.0)
MCV: 86.6 fL (ref 80.0–100.0)
Platelets: 597 10*3/uL — ABNORMAL HIGH (ref 150–400)
RBC: 4.86 MIL/uL (ref 3.87–5.11)
RDW: 14.8 % (ref 11.5–15.5)
WBC: 12 10*3/uL — ABNORMAL HIGH (ref 4.0–10.5)
nRBC: 0 % (ref 0.0–0.2)

## 2021-09-24 LAB — BASIC METABOLIC PANEL
Anion gap: 12 (ref 5–15)
BUN: 15 mg/dL (ref 8–23)
CO2: 29 mmol/L (ref 22–32)
Calcium: 9.2 mg/dL (ref 8.9–10.3)
Chloride: 96 mmol/L — ABNORMAL LOW (ref 98–111)
Creatinine, Ser: 0.59 mg/dL (ref 0.44–1.00)
GFR, Estimated: >60 mL/min (ref >60)
Glucose, Bld: 98 mg/dL (ref 70–99)
Potassium: 3.7 mmol/L (ref 3.5–5.1)
Sodium: 137 mmol/L (ref 135–145)

## 2021-09-24 LAB — URINALYSIS, ROUTINE W REFLEX MICROSCOPIC
Glucose, UA: NEGATIVE mg/dL
Ketones, ur: 40 mg/dL — AB
Leukocytes,Ua: NEGATIVE
Nitrite: NEGATIVE
Protein, ur: NEGATIVE mg/dL
Specific Gravity, Urine: 1.025 (ref 1.005–1.030)
pH: 6 (ref 5.0–8.0)

## 2021-09-24 LAB — TROPONIN I (HIGH SENSITIVITY)
Troponin I (High Sensitivity): 18 ng/L — ABNORMAL HIGH (ref <18)
Troponin I (High Sensitivity): 18 ng/L — ABNORMAL HIGH (ref <18)

## 2021-09-24 LAB — URINALYSIS, MICROSCOPIC (REFLEX)

## 2021-09-24 LAB — RESP PANEL BY RT-PCR (FLU A&B, COVID) ARPGX2
Influenza A by PCR: NEGATIVE
Influenza B by PCR: NEGATIVE
SARS Coronavirus 2 by RT PCR: NEGATIVE

## 2021-09-24 MED ORDER — SODIUM CHLORIDE 0.9 % IV BOLUS
1000.0000 mL | Freq: Once | INTRAVENOUS | Status: AC
  Administered 2021-09-24: 1000 mL via INTRAVENOUS

## 2021-09-24 NOTE — ED Notes (Signed)
Patient discharged to home.  All discharge instructions reviewed.  Patient verbalized understanding via teachback method.  VS WDL.  Respirations even and unlabored.  Wheelchair out of ED.   ?

## 2021-09-24 NOTE — ED Notes (Signed)
Still unable to void to provide urine spec, 2nd IVF NS bolus initiated per ED MD orders ?

## 2021-09-24 NOTE — ED Provider Notes (Signed)
?[facility] EMERGENCY DEPARTMENT ?Provider Note ? ? ?CSN: [id number] ?Arrival date & time: 09/24/21  1147 ? ?  ? ?History ? ?Chief Complaint  ?Patient presents with  ? Chest Pain  ? Dizziness  ? ? ?Carol Baxter is a 81 y.o. female. ? ?Pt is a 81 yo with a pmhx of arthritis, osteoporosis, htn, and high cholesterol.  She has been feeling dizzy for the past few days when she stands up.  She has not had an appetite.  She has had intermittent cp. No cp now.  She called her pcp who recommended coming to the ED for further eval. ? ? ?  ? ?Home Medications ?Prior to Admission medications   ?Medication Sig Start Date End Date Taking? Authorizing Provider  ?aspirin-acetaminophen-caffeine (EXCEDRIN EXTRA STRENGTH) 250-250-65 MG tablet Take 2 tablets by mouth every 6 (six) hours as needed for headache or migraine (pain).    [provider]  ?chlorpheniramine (CHLOR-TRIMETON) 4 MG tablet Take 4 mg by mouth every 4 (four) hours. Pt takes every 4 hours due to sinus    [provider]  ?cholecalciferol (VITAMIN D3) 25 MCG (1000 UNIT) tablet Take 4,000 Units by mouth daily.    [provider]  ?ezetimibe-simvastatin (VYTORIN) 10-20 MG tablet Take 1 tablet by mouth daily.    [provider]  ?hydroxypropyl methylcellulose / hypromellose (ISOPTO TEARS / GONIOVISC) 2.5 % ophthalmic solution Place 1 drop into both eyes 3 (three) times daily as needed for dry eyes.    [provider]  ?loperamide (IMODIUM) 2 MG capsule Take 4 mg by mouth as needed for diarrhea or loose stools.    [provider]  ?montelukast (SINGULAIR) 10 MG tablet Take 10 mg by mouth at bedtime.    [provider]  ?pseudoephedrine (SUDAFED) 60 MG tablet Take 60 mg by mouth every 4 (four) hours. Pt takes every 4 hours due to sinus problems    [provider]  ?spironolactone (ALDACTONE) 25 MG tablet Take 25 mg by mouth See admin instructions. Pt takes daily but May take additional dose if  bloated    [provider]  ?   ? ?Allergies    ?Meperidine; Shellfish allergy; Egg solids, whole; Penicillins; and Thiopental   ? ?Review of Systems   ?Review of Systems  ?Cardiovascular:  Positive for chest pain.  ?Neurological:  Positive for dizziness.  ?All other systems reviewed and are negative. ? ?Physical Exam ?Updated Vital Signs ?BP 106/61   Pulse 73   Temp 97.8 ?F (36.6 ?C) (Oral)   Resp 13   Ht  (1.549 m)   Wt 43.1 kg   SpO2 100%   BMI 17.95 kg/m?  ?Physical Exam ?Vitals and nursing note reviewed.  ?Constitutional:   ?   Appearance: She is well-developed.  ?HENT:  ?   Head: Normocephalic and atraumatic.  ?Eyes:  ?   Extraocular Movements: Extraocular movements intact.  ?   Pupils: Pupils are equal, round, and reactive to light.  ?Cardiovascular:  ?   Rate and Rhythm: Normal rate and regular rhythm.  ?   Heart sounds: Normal heart sounds.  ?Pulmonary:  ?   Effort: Pulmonary effort is normal.  ?Abdominal:  ?   General: Bowel sounds are normal.  ?   Palpations: Abdomen is soft.  ?Musculoskeletal:     ?   General: Normal range of motion.  ?   Cervical back: Normal range of motion and neck supple.  ?Skin: ?   General:  Skin is warm.  ?   Capillary Refill: Capillary refill takes less than 2 seconds.  ?Neurological:  ?   General: No focal deficit present.  ?   Mental Status: She is alert and oriented to person, place, and time.  ?Psychiatric:     ?   Mood and Affect: Mood normal.     ?   Behavior: Behavior normal.  ? ? ?ED Results / Procedures / Treatments   ?Labs ?(all labs ordered are listed, but only abnormal results are displayed) ?Labs Reviewed  ?BASIC METABOLIC PANEL - Abnormal; Notable for the following components:  ?    Result Value  ? Chloride 96 (*)   ? All other components within normal limits  ?CBC - Abnormal; Notable for the following components:  ? WBC 12.0 (*)   ? Platelets 597 (*)   ? All other components within normal limits  ?TROPONIN I (HIGH SENSITIVITY) - Abnormal;  Notable for the following components:  ? Troponin I (High Sensitivity) 18 (*)   ? All other components within normal limits  ?RESP PANEL BY RT-PCR (FLU A&B, COVID) ARPGX2  ?URINALYSIS, ROUTINE W REFLEX MICROSCOPIC  ?TROPONIN I (HIGH SENSITIVITY)  ? ? ?EKG ?EKG Interpretation ? ?Date/Time:  Tuesday September 24 2021 12:09:46 EDT ?Ventricular Rate:  106 ?PR Interval:  114 ?QRS Duration: 86 ?QT Interval:  320 ?QTC Calculation: 425 ?R Axis:   86 ?Text Interpretation: Sinus tachycardia Right atrial enlargement Nonspecific T wave abnormality Abnormal ECG When compared with ECG of 18-Jul-2020 18:21, PREVIOUS ECG IS PRESENT No significant change since last tracing Confirmed by Jacalyn Lefevre [phone]) on 09/24/2021 4:17:22 PM ? ?Radiology ?DG Chest 2 View ? ?Result Date: 09/24/2021 ?CLINICAL DATA:  Chest pain and dizziness. EXAM: CHEST - 2 VIEW COMPARISON:  None. FINDINGS: Lungs are hyperexpanded. The lungs are clear without focal pneumonia, edema, pneumothorax or pleural effusion. The cardiopericardial silhouette is within normal limits for size. Bones are diffusely demineralized. Nodule opacity at the left lung base compatible with calcification of the costal cartilage anterior left sixth rib. IMPRESSION: Hyperexpansion without acute cardiopulmonary findings. Electronically Signed   By: Kennith Center M.D.   On: 09/24/2021 13:07  ? ?CT Head Wo Contrast ? ?Result Date: 09/24/2021 ?CLINICAL DATA:  Mental status changes of unknown cause, intermittent chest pain with dizziness for 2 weeks, single episode of vomiting, occasional nausea, history hypertension EXAM: CT HEAD WITHOUT CONTRAST TECHNIQUE: Contiguous axial images were obtained from the base of the skull through the vertex without intravenous contrast. RADIATION DOSE REDUCTION: This exam was performed according to the departmental dose-optimization program which includes automated exposure control, adjustment of the mA and/or kV according to patient size and/or use of iterative  reconstruction technique. COMPARISON:  None FINDINGS: Brain: Generalized atrophy. Normal ventricular morphology. No midline shift or mass effect. Otherwise normal appearance of brain parenchyma. No intracranial hemorrhage, mass lesion,, or acute infarction. No extra-axial fluid collections. Vascular: Atherosclerotic calcification of internal carotid and vertebral arteries at skull base Skull: Biparietal thinning. Demineralized. No acute osseous findings. Sinuses/Orbits: Clear Other: N/A IMPRESSION: Generalized atrophy. No acute intracranial abnormalities. Electronically Signed   By: Ulyses Southward M.D.   On: 09/24/2021 15:21   ? ?Procedures ?Procedures  ? ? ?Medications Ordered in ED ?Medications  ?sodium chloride 0.9 % bolus 1,000 mL (0 mLs Intravenous Stopped 09/24/21 1620)  ?sodium chloride 0.9 % bolus 1,000 mL (1,000 mLs Intravenous New Bag/Given 09/24/21 1629)  ? ? ?ED Course/ Medical Decision Making/ A&P ?  ?                        ?  Medical Decision Making ?Amount and/or Complexity of Data Reviewed ?Labs: ordered. ?Radiology: ordered. ? ? ?This patient presents to the ED for concern of dizziness and cp, this involves an extensive number of treatment options, and is a complaint that carries with it a high risk of complications and morbidity.  The differential diagnosis includes cardiac, cva, tia, electrolyte abn ? ? ?Co morbidities that complicate the patient evaluation ? ?arthritis, osteoporosis, htn, and high cholesterol. ? ? ?Additional history obtained: ? ?Additional history obtained from epic chart review ? ? ? ?Lab Tests: ? ?I Ordered, and personally interpreted labs.  The pertinent results include:  wbc 12, bmp nl, trop 18 ? ? ?Imaging Studies ordered: ? ?I ordered imaging studies including ct head/cxr  ?I independently visualized and interpreted imaging which showed CT head: ?  ?IMPRESSION:  ?Generalized atrophy.  ?   ?No acute intracranial abnormalities.  ?CXR: ?  ?IMPRESSION:  ?Hyperexpansion without acute  cardiopulmonary findings.  ? ?I agree with the radiologist interpretation ? ? ?Cardiac Monitoring: ? ?The patient was maintained on a cardiac monitor.  I personally viewed and interpreted the cardiac monitored w

## 2021-09-24 NOTE — ED Triage Notes (Signed)
Intermittent chest pain with dizziness for two weeks.  Pt states she is slightly dizzy but no chest pain at the moment.  No sob.  One episode of vomiting two weeks ago.  Occasional nausea.  Pt states her chest pain was left sided and radiated to left shoulder.  Pain lasts 10-20 seconds.  No acute distress noted. ?

## 2021-09-24 NOTE — Discharge Instructions (Signed)
Return for any new or worse symptoms.  Your heart markers both were normal without any significant change. ?

## 2021-09-24 NOTE — ED Provider Notes (Addendum)
Patient's delta troponin is unchanged at 18.  Will reevaluate patient regarding the dizziness.  But may be stable for discharge home.  Patient states she is feeling much better.  Never really had any true room spinning.  With the fluids made her feel better. ?  ?Vanetta MuldersZackowski, Phuc Kluttz, MD ?09/24/21 2008 ? ?  ?Vanetta MuldersZackowski, Lamoille Shellhammer, MD ?09/24/21 2030 ? ?

## 2021-09-24 NOTE — ED Notes (Signed)
Pt. Reports less dizziness when lying back and has no resp. Distress.  Pt. Is NSR on monitor. ?

## 2021-11-21 ENCOUNTER — Encounter (HOSPITAL_BASED_OUTPATIENT_CLINIC_OR_DEPARTMENT_OTHER): Payer: Self-pay

## 2021-11-21 ENCOUNTER — Emergency Department (HOSPITAL_BASED_OUTPATIENT_CLINIC_OR_DEPARTMENT_OTHER)
Admission: EM | Admit: 2021-11-21 | Discharge: 2021-11-21 | Disposition: A | Discharge status: Home / Self Care | Payer: Federal, State, Local not specified - PPO | Attending: Emergency Medicine | Admitting: Emergency Medicine

## 2021-11-21 ENCOUNTER — Other Ambulatory Visit: Payer: Self-pay

## 2021-11-21 DIAGNOSIS — I10 Essential (primary) hypertension: Secondary | ICD-10-CM | POA: Insufficient documentation

## 2021-11-21 DIAGNOSIS — R197 Diarrhea, unspecified: Secondary | ICD-10-CM | POA: Insufficient documentation

## 2021-11-21 DIAGNOSIS — E860 Dehydration: Secondary | ICD-10-CM | POA: Insufficient documentation

## 2021-11-21 DIAGNOSIS — Z7982 Long term (current) use of aspirin: Secondary | ICD-10-CM | POA: Insufficient documentation

## 2021-11-21 DIAGNOSIS — Z79899 Other long term (current) drug therapy: Secondary | ICD-10-CM | POA: Insufficient documentation

## 2021-11-21 LAB — CBC WITH DIFFERENTIAL/PLATELET
Abs Immature Granulocytes: 0.03 10*3/uL (ref 0.00–0.07)
Basophils Absolute: 0.1 10*3/uL (ref 0.0–0.1)
Basophils Relative: 1 %
Eosinophils Absolute: 0.2 10*3/uL (ref 0.0–0.5)
Eosinophils Relative: 2 %
HCT: 41.1 % (ref 36.0–46.0)
Hemoglobin: 12.5 g/dL (ref 12.0–15.0)
Immature Granulocytes: 0 %
Lymphocytes Relative: 13 %
Lymphs Abs: 1.2 10*3/uL (ref 0.7–4.0)
MCH: 26.3 pg (ref 26.0–34.0)
MCHC: 30.4 g/dL (ref 30.0–36.0)
MCV: 86.5 fL (ref 80.0–100.0)
Monocytes Absolute: 1.1 10*3/uL — ABNORMAL HIGH (ref 0.1–1.0)
Monocytes Relative: 12 %
Neutro Abs: 6.7 10*3/uL (ref 1.7–7.7)
Neutrophils Relative %: 72 %
Platelets: 522 10*3/uL — ABNORMAL HIGH (ref 150–400)
RBC: 4.75 MIL/uL (ref 3.87–5.11)
RDW: 14.2 % (ref 11.5–15.5)
WBC: 9.3 10*3/uL (ref 4.0–10.5)
nRBC: 0 % (ref 0.0–0.2)

## 2021-11-21 LAB — COMPREHENSIVE METABOLIC PANEL
ALT: 13 U/L (ref 0–44)
AST: 18 U/L (ref 15–41)
Albumin: 3.6 g/dL (ref 3.5–5.0)
Alkaline Phosphatase: 76 U/L (ref 38–126)
Anion gap: 13 (ref 5–15)
BUN: 18 mg/dL (ref 8–23)
CO2: 27 mmol/L (ref 22–32)
Calcium: 9 mg/dL (ref 8.9–10.3)
Chloride: 95 mmol/L — ABNORMAL LOW (ref 98–111)
Creatinine, Ser: 0.68 mg/dL (ref 0.44–1.00)
GFR, Estimated: >60 mL/min (ref >60)
Glucose, Bld: 101 mg/dL — ABNORMAL HIGH (ref 70–99)
Potassium: 3.6 mmol/L (ref 3.5–5.1)
Sodium: 135 mmol/L (ref 135–145)
Total Bilirubin: 0.7 mg/dL (ref 0.3–1.2)
Total Protein: 7.1 g/dL (ref 6.5–8.1)

## 2021-11-21 LAB — MAGNESIUM: Magnesium: 1.4 mg/dL — ABNORMAL LOW (ref 1.7–2.4)

## 2021-11-21 MED ORDER — SODIUM CHLORIDE 0.9 % IV BOLUS
1000.0000 mL | Freq: Once | INTRAVENOUS | Status: AC
  Administered 2021-11-21: 1000 mL via INTRAVENOUS

## 2021-11-21 MED ORDER — MAGNESIUM SULFATE 2 GM/50ML IV SOLN
2.0000 g | Freq: Once | INTRAVENOUS | Status: AC
  Administered 2021-11-21: 2 g via INTRAVENOUS
  Filled 2021-11-21: qty 50

## 2021-11-21 NOTE — ED Provider Notes (Signed)
?[facility] EMERGENCY DEPARTMENT ?Provider Note ? ? ?CSN: [id number] ?Arrival date & time: 11/21/21  1002 ? ?  ? ?History ? ?Chief Complaint  ?Patient presents with  ? Diarrhea  ? ? ?Carol Baxter is a 81 y.o. female. ? ?Pt is an 81 yo female with a pmhx significant for arthritis, osteoporosis, htn, and high cholesterol.  Pt is here with c/o diarrhea which started on Sunday, May 7.  Pt denies any abd pain.  She said she gets these diarrhea episodes periodically.  She is here mainly because she feels that she's dehydrated because anything she eats goes right through her. ? ? ?  ? ?Home Medications ?Prior to Admission medications   ?Medication Sig Start Date End Date Taking? Authorizing Provider  ?aspirin-acetaminophen-caffeine (EXCEDRIN EXTRA STRENGTH) 250-250-65 MG tablet Take 2 tablets by mouth every 6 (six) hours as needed for headache or migraine (pain).    [provider]  ?chlorpheniramine (CHLOR-TRIMETON) 4 MG tablet Take 4 mg by mouth every 4 (four) hours. Pt takes every 4 hours due to sinus    [provider]  ?cholecalciferol (VITAMIN D3) 25 MCG (1000 UNIT) tablet Take 4,000 Units by mouth daily.    [provider]  ?ezetimibe-simvastatin (VYTORIN) 10-20 MG tablet Take 1 tablet by mouth daily.    [provider]  ?hydroxypropyl methylcellulose / hypromellose (ISOPTO TEARS / GONIOVISC) 2.5 % ophthalmic solution Place 1 drop into both eyes 3 (three) times daily as needed for dry eyes.    [provider]  ?loperamide (IMODIUM) 2 MG capsule Take 4 mg by mouth as needed for diarrhea or loose stools.    [provider]  ?montelukast (SINGULAIR) 10 MG tablet Take 10 mg by mouth at bedtime.    [provider]  ?pseudoephedrine (SUDAFED) 60 MG tablet Take 60 mg by mouth every 4 (four) hours. Pt takes every 4 hours due to sinus problems    [provider]  ?spironolactone (ALDACTONE) 25 MG tablet Take 25 mg by mouth See admin instructions.  Pt takes daily but May take additional dose if bloated    [provider]  ?   ? ?Allergies    ?Meperidine; Shellfish allergy; Egg solids, whole; Penicillins; and Thiopental   ? ?Review of Systems   ?Review of Systems  ?Gastrointestinal:  Positive for diarrhea.  ? ?Physical Exam ?Updated Vital Signs ?BP (!) 124/54   Pulse 73   Temp 97.6 ?F (36.4 ?C) (Oral)   Resp 19   Ht  (1.549 m)   Wt 39.2 kg   SpO2 99%   BMI 16.33 kg/m?  ?Physical Exam ?Vitals and nursing note reviewed.  ?Constitutional:   ?   Appearance: Normal appearance.  ?HENT:  ?   Head: Normocephalic and atraumatic.  ?   Right Ear: External ear normal.  ?   Left Ear: External ear normal.  ?   Nose: Nose normal.  ?   Mouth/Throat:  ?   Mouth: Mucous membranes are dry.  ?Eyes:  ?   Extraocular Movements: Extraocular movements intact.  ?   Conjunctiva/sclera: Conjunctivae normal.  ?   Pupils: Pupils are equal, round, and reactive to light.  ?Cardiovascular:  ?   Rate and Rhythm: Normal rate and regular rhythm.  ?   Pulses: Normal pulses.  ?   Heart sounds: Normal heart sounds.  ?Pulmonary:  ?   Effort: Pulmonary effort is normal.  ?   Breath sounds: Normal breath sounds.  ?Abdominal:  ?  General: Abdomen is flat. Bowel sounds are normal.  ?   Palpations: Abdomen is soft.  ?Musculoskeletal:     ?   General: Normal range of motion.  ?   Cervical back: Normal range of motion and neck supple.  ?Skin: ?   General: Skin is warm.  ?   Capillary Refill: Capillary refill takes less than 2 seconds.  ?Neurological:  ?   General: No focal deficit present.  ?   Mental Status: She is alert and oriented to person, place, and time.  ?Psychiatric:     ?   Mood and Affect: Mood normal.     ?   Behavior: Behavior normal.  ? ? ?ED Results / Procedures / Treatments   ?Labs ?(all labs ordered are listed, but only abnormal results are displayed) ?Labs Reviewed  ?COMPREHENSIVE METABOLIC PANEL - Abnormal; Notable for the following components:  ?    Result Value   ? Chloride 95 (*)   ? Glucose, Bld 101 (*)   ? All other components within normal limits  ?CBC WITH DIFFERENTIAL/PLATELET - Abnormal; Notable for the following components:  ? Platelets 522 (*)   ? Monocytes Absolute 1.1 (*)   ? All other components within normal limits  ?MAGNESIUM - Abnormal; Notable for the following components:  ? Magnesium 1.4 (*)   ? All other components within normal limits  ?URINALYSIS, ROUTINE W REFLEX MICROSCOPIC  ? ? ?EKG ?None ? ?Radiology ?No results found. ? ?Procedures ?Procedures  ? ? ?Medications Ordered in ED ?Medications  ?magnesium sulfate IVPB 2 g 50 mL (2 g Intravenous New Bag/Given 11/21/21 1124)  ?sodium chloride 0.9 % bolus 1,000 mL ( Intravenous Stopped 11/21/21 1137)  ? ? ?ED Course/ Medical Decision Making/ A&P ?  ?                        ?Medical Decision Making ?Amount and/or Complexity of Data Reviewed ?Labs: ordered. ? ?Risk ?Prescription drug management. ? ? ?This patient presents to the ED for concern of diarrhea, this involves an extensive number of treatment options, and is a complaint that carries with it a high risk of complications and morbidity.  The differential diagnosis includes food intolerance, electrolyte abn, gastroenteritis, bacterial diarrhea ? ? ?Co morbidities that complicate the patient evaluation ? ?arthritis, osteoporosis, htn, and high cholestero ? ? ?Additional history obtained: ? ?Additional history obtained from epic chart review ? ? ? ?Lab Tests: ? ?I Ordered, and personally interpreted labs.  The pertinent results include:  cbc nl; mg low at 1.4; cmp nl ? ? ? ?Cardiac Monitoring: ? ?The patient was maintained on a cardiac monitor.  I personally viewed and interpreted the cardiac monitored which showed an underlying rhythm of: nsr ? ? ?Medicines ordered and prescription drug management: ? ?I ordered medication including IVFs  for dehydration and mg for hypomag  ?Reevaluation of the patient after these medicines showed that the patient  improved ?I have reviewed the patients home medicines and have made adjustments as needed ? ? ?Test Considered: ? ?Ct, but pt is nontender, afebrile, and wbc is nl ? ? ?Critical Interventions: ? ?ivfs ? ? ?Problem List / ED Course: ? ?Diarrhea/Dehydration:  pt is feeling much better after fluids.  She is able to tolerate po fluids.  She feels ready to go home.  She has no indication for abx or any further work up.  She is to return if worse. F/u with pcp. ?Hypomag:  pt  given 2 g IV.   ? ? ?Reevaluation: ? ?After the interventions noted above, I reevaluated the patient and found that they have :improved ? ? ?Social Determinants of Health: ? ?Lives at home with her husband ? ? ?Dispostion: ? ?After consideration of the diagnostic results and the patients response to treatment, I feel that the patent would benefit from discharge with outpatient f/u.   ? ? ? ? ? ? ? ?Final Clinical Impression(s) / ED Diagnoses ?Final diagnoses:  ?Diarrhea, unspecified type  ?Dehydration  ?Hypomagnesemia  ? ? ?Rx / DC Orders ?ED Discharge Orders   ? ? None  ? ?  ? ? ?  ?Jacalyn Lefevre, MD ?11/21/21 1225 ? ?

## 2021-11-21 NOTE — ED Triage Notes (Signed)
C/o hx of frequent diarrhea. Started having diarrhea on Sunday with nausea. Denies abdominal pain ?

## 2023-03-19 IMAGING — CT CT ABD-PELV W/ CM
2 of 5 series · 16 of 46 positions shown, 18 images · IV contrast (omnipaque)
Comparison: CT abdomen and pelvis dated July 18, 2020

CLINICAL DATA: Diverticulitis suspected

EXAM:
CT ABDOMEN AND PELVIS WITH CONTRAST
TECHNIQUE: Multidetector CT imaging of the abdomen and pelvis was performed
using the standard protocol following bolus administration of
intravenous contrast.
CONTRAST:  100mL OMNIPAQUE IOHEXOL 300 MG/ML  SOLN

[Series 2: axial st · axial · 0.68mm/px · z∈[-569,-209]mm · 13 of 80 slices shown, 15 images]
[im 4/80  soft-tissue]
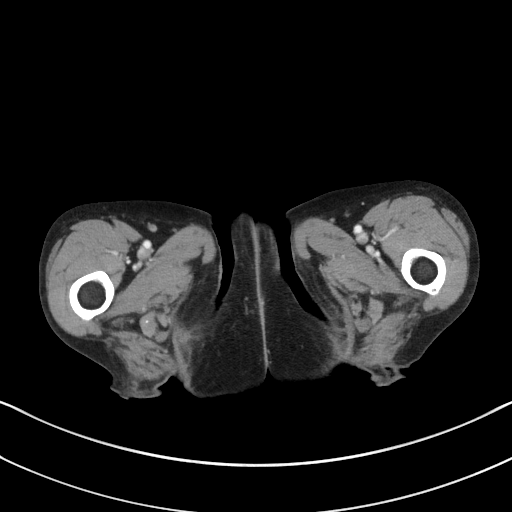
[im 4/80  bone]
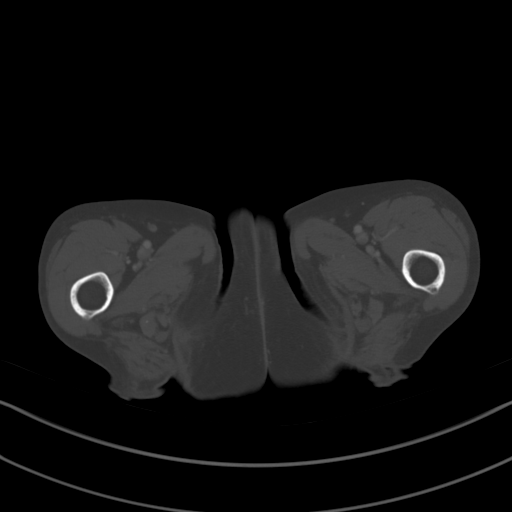
[im 12/80  soft-tissue]
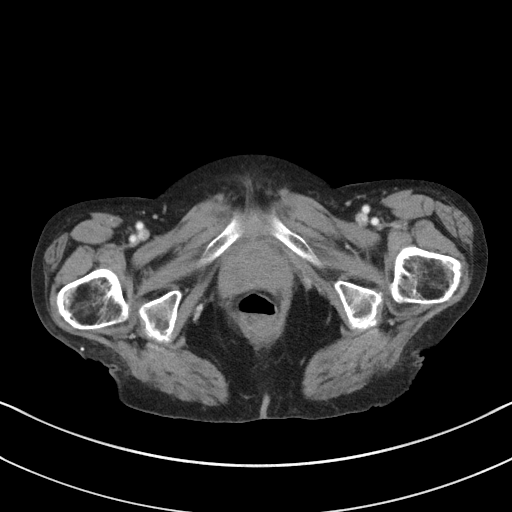
[im 16/80  soft-tissue]
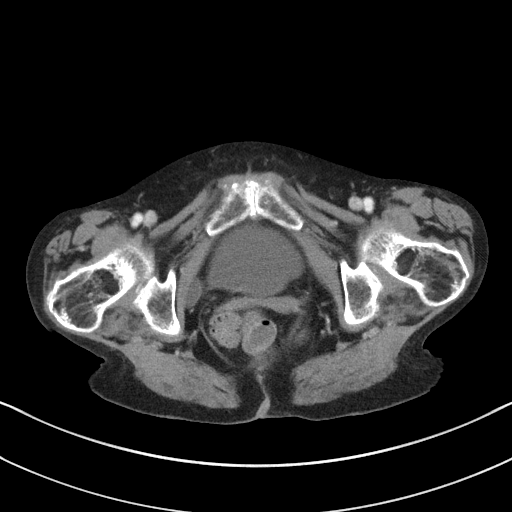
[im 24/80  soft-tissue]
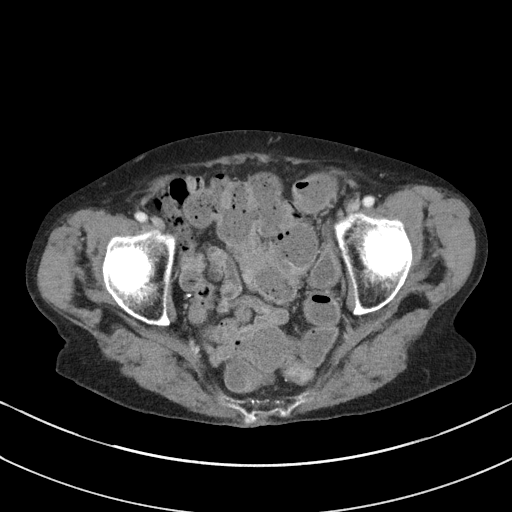
[im 28/80  soft-tissue]
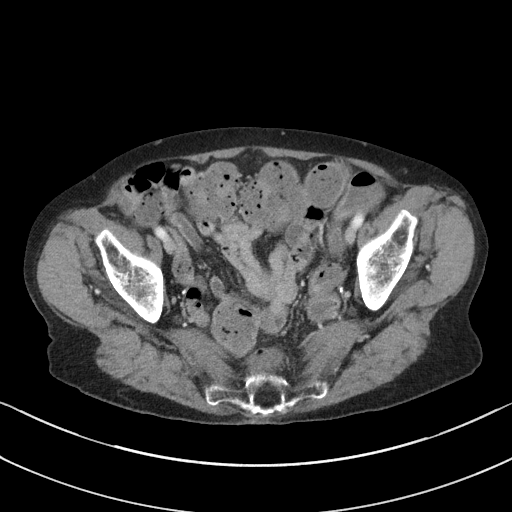
[im 36/80  soft-tissue]
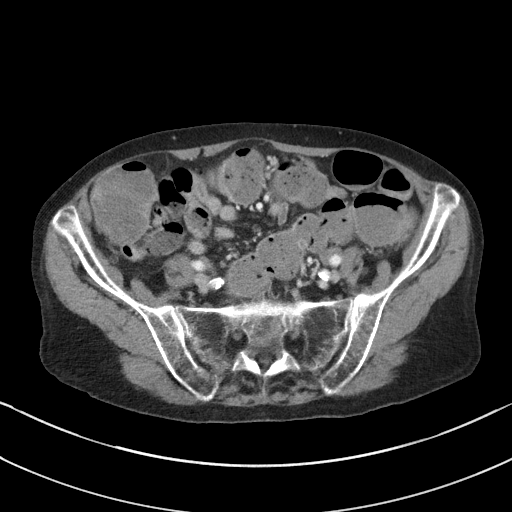
[im 40/80  soft-tissue]
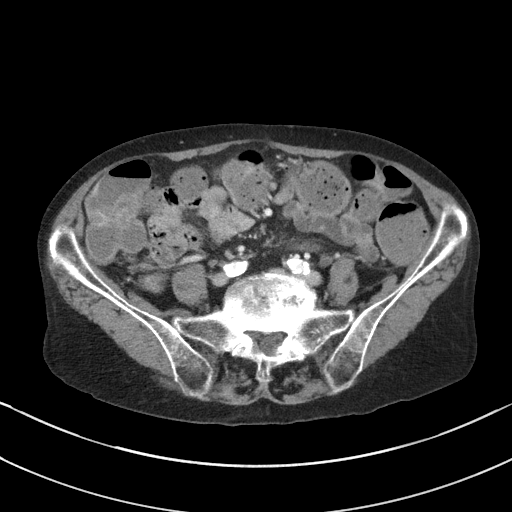
[im 44/80  soft-tissue]
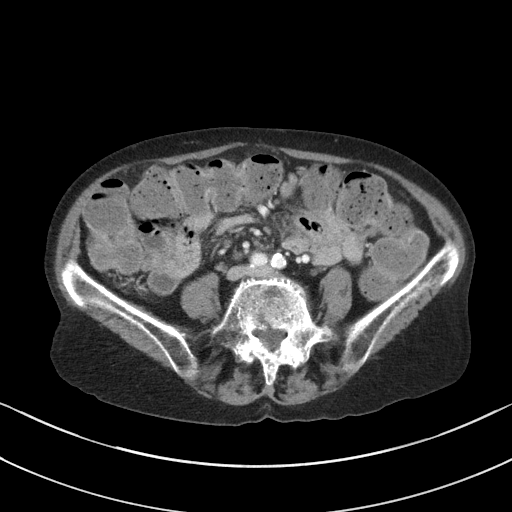
[im 52/80  soft-tissue]
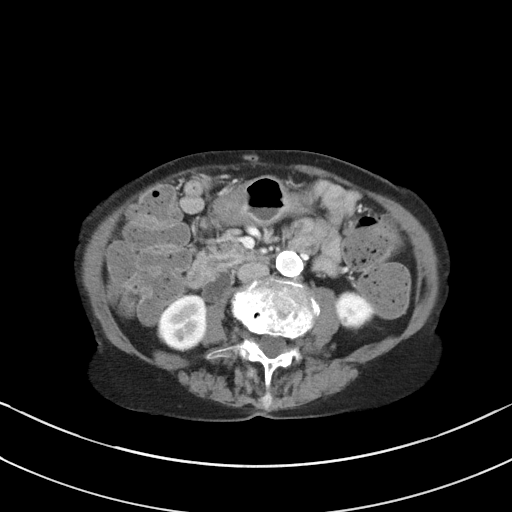
[im 52/80  bone]
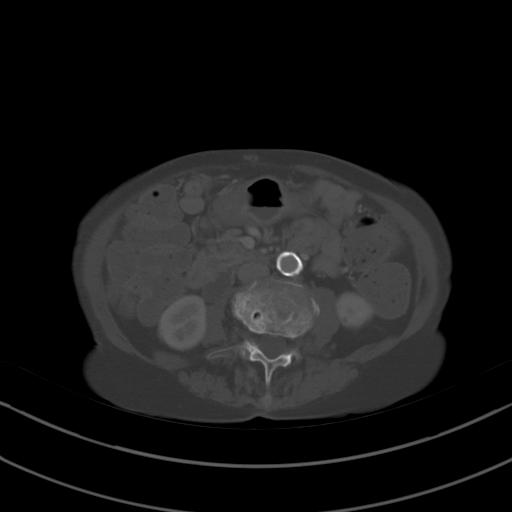
[im 56/80  soft-tissue]
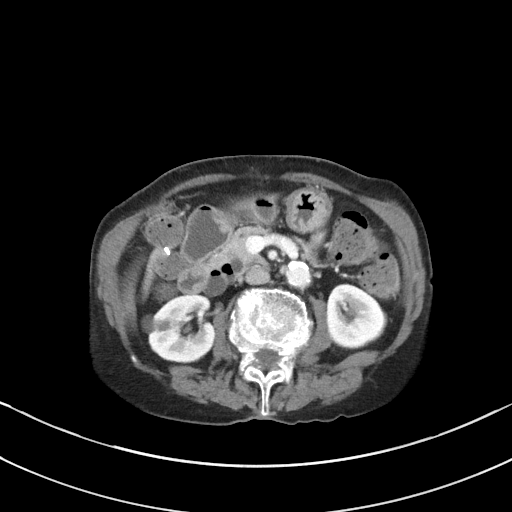
[im 64/80  soft-tissue]
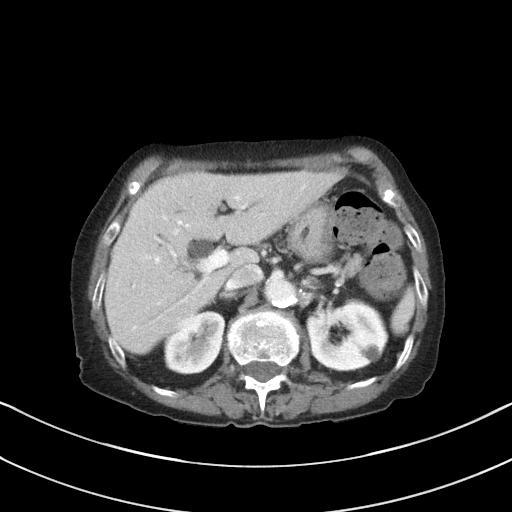
[im 68/80  soft-tissue]
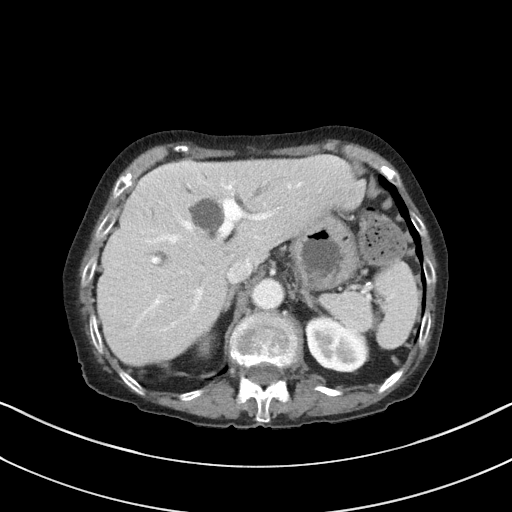
[im 76/80  soft-tissue]
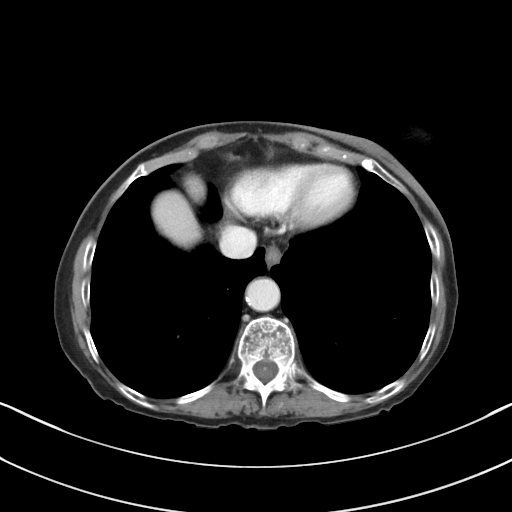

[Series 5: coronal st · coronal · 0.69mm/px · 3 of 72 slices shown]
[im 24/72  soft-tissue]
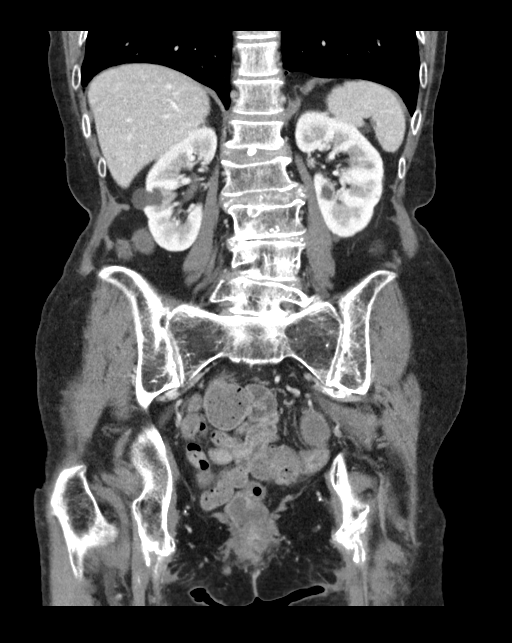
[im 32/72  soft-tissue]
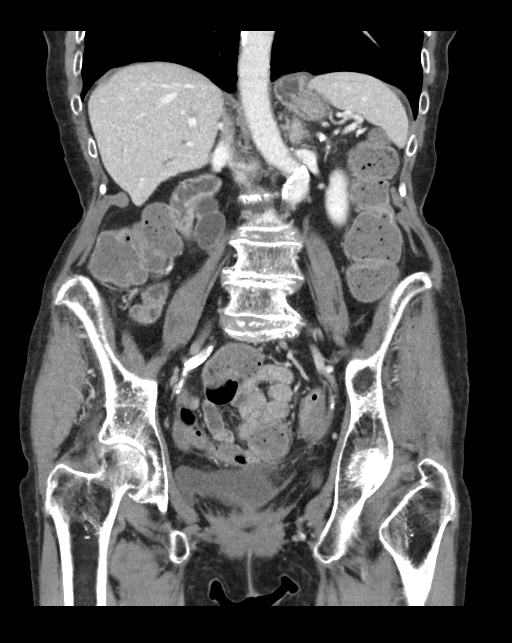
[im 40/72  soft-tissue]
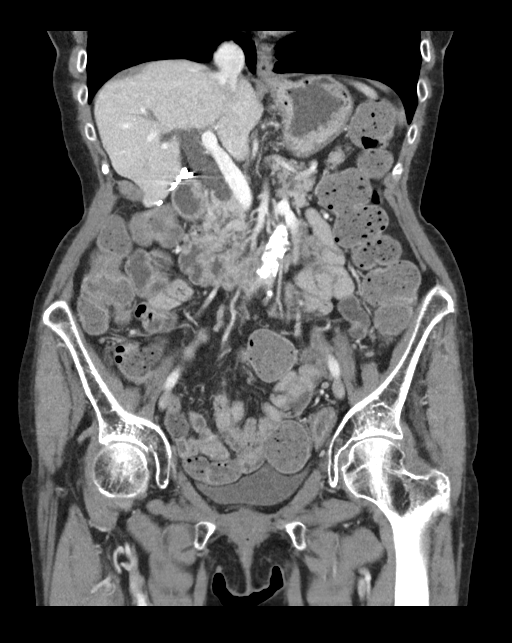

[16 of 46 positions shown; findings below may reference images not displayed]

FINDINGS: Lower chest: No acute abnormality.

Hepatobiliary: No focal liver lesions. Prior cholecystectomy with
moderate intra and extrahepatic biliary ductal dilation which is
unchanged when compared with prior exam.

Pancreas: Unremarkable. No pancreatic ductal dilatation or
surrounding inflammatory changes.

Spleen: Normal in size without focal abnormality.

Adrenals/Urinary Tract: Bilateral adrenal glands are unremarkable.
Kidneys enhance symmetrically with no evidence hydronephrosis or
nephrolithiasis. Bilateral low-attenuation renal lesions which are
likely simple cysts.

Stomach/Bowel: Stomach is within normal limits. Appendix appears
normal. No evidence of bowel wall thickening, distention, or
inflammatory changes.

Vascular/Lymphatic: Aortic atherosclerosis. No enlarged abdominal or
pelvic lymph nodes.

Reproductive: Bilateral adnexa are unremarkable.

Other: No abdominal wall hernia or abnormality. No abdominopelvic
ascites.

Musculoskeletal: Unchanged L1 and L2 compression deformities.
Moderate degenerative disc disease of the lumbar spine. No
aggressive appearing osseous lesions.
IMPRESSION: 1. No acute findings in the abdomen or pelvis.
2. Prior cholecystectomy with moderate intra and extrahepatic
biliary ductal dilation which is unchanged when compared with prior
exam.
3.  Aortic Atherosclerosis ([id number]).

## 2023-07-13 IMAGING — CT CT HEAD W/O CM
3 series · 15 of 47 positions shown, 18 images · non-contrast
Comparison: None

CLINICAL DATA: Mental status changes of unknown cause, intermittent
chest pain with dizziness for 2 weeks, single episode of vomiting,
occasional nausea, history hypertension



[Series 2: head wo · axial · 0.39mm/px · z∈[-148,-23]mm · 9 of 30 slices shown, 12 images]
[im 3/30  brain]
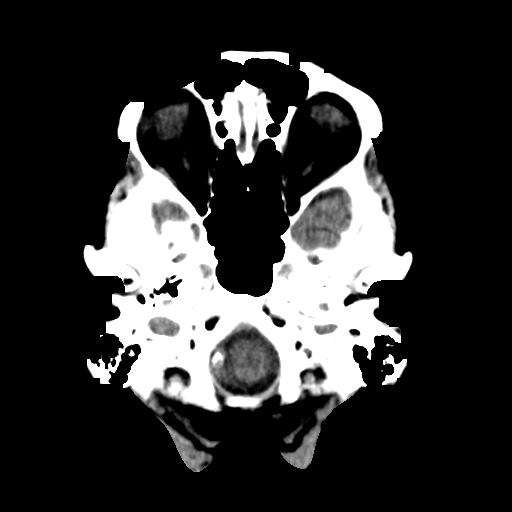
[im 3/30  bone]
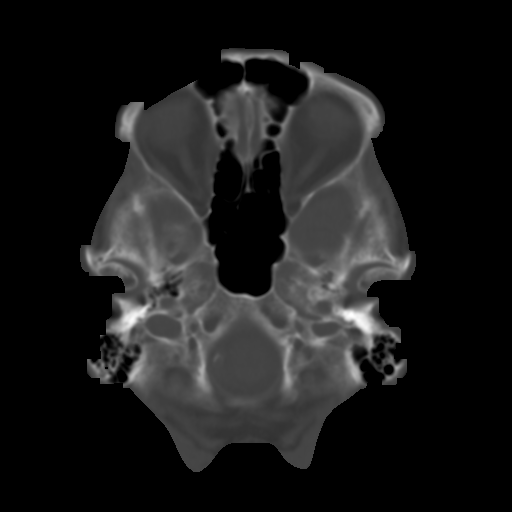
[im 6/30  brain]
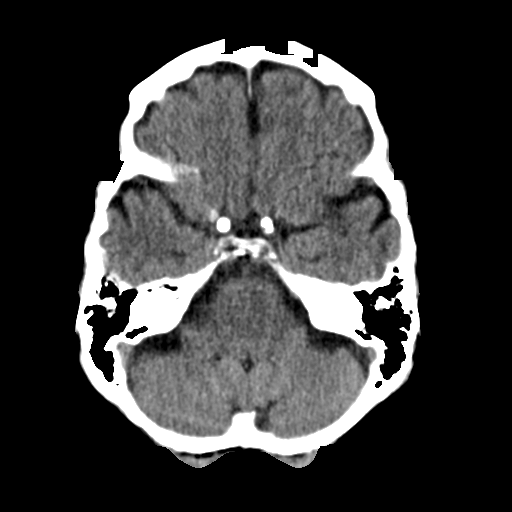
[im 9/30  brain]
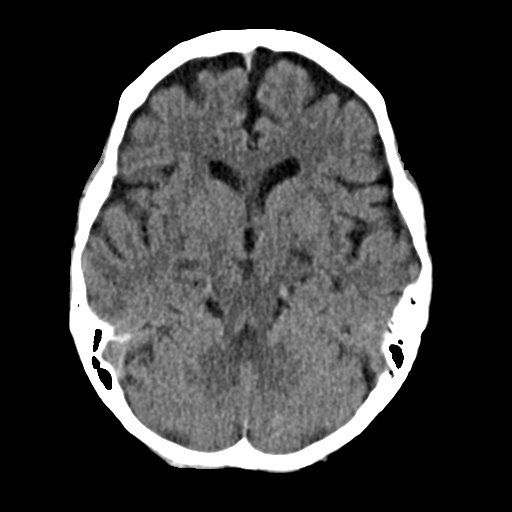
[im 12/30  brain]
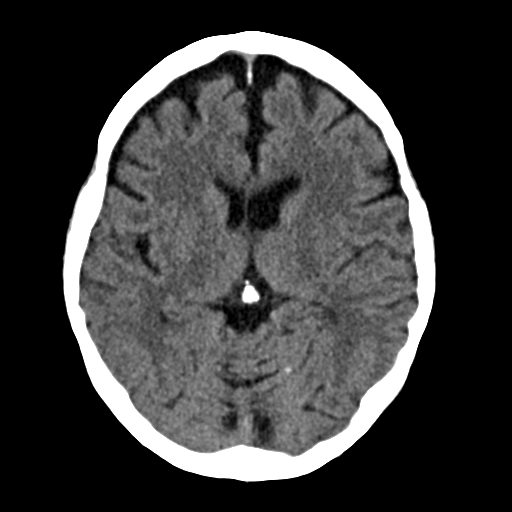
[im 16/30  brain]
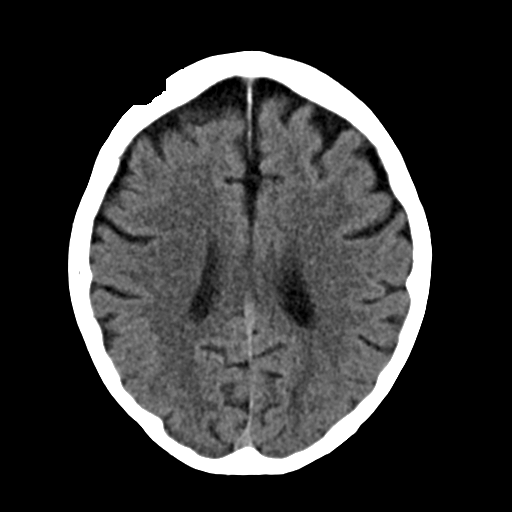
[im 16/30  bone]
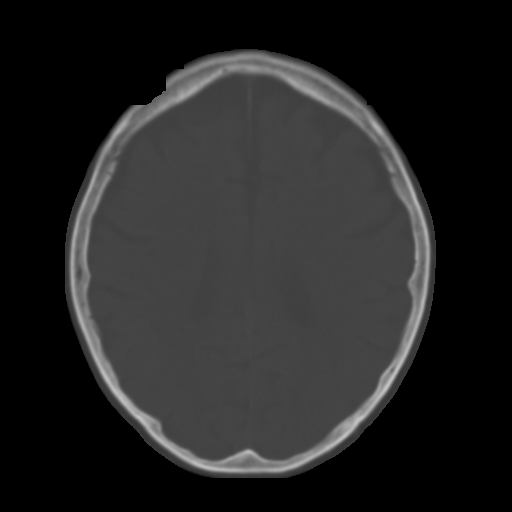
[im 19/30  brain]
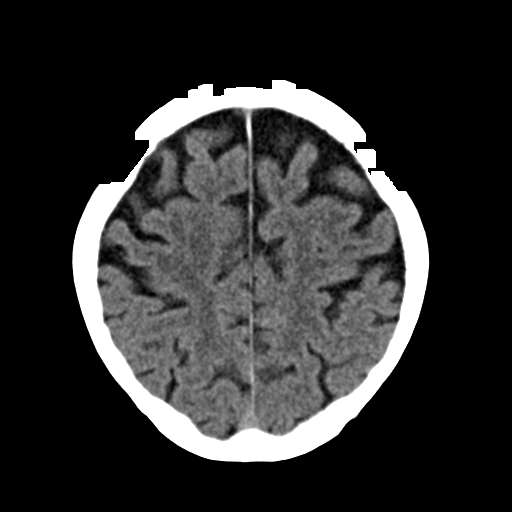
[im 22/30  brain]
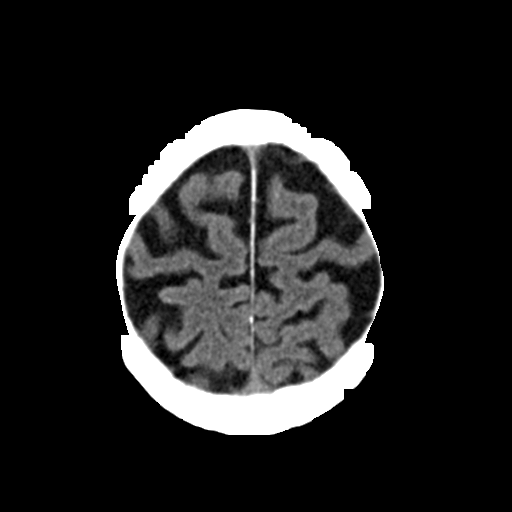
[im 25/30  brain]
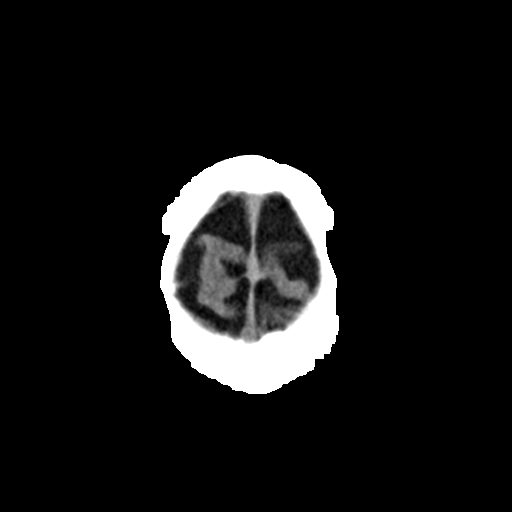
[im 28/30  brain]
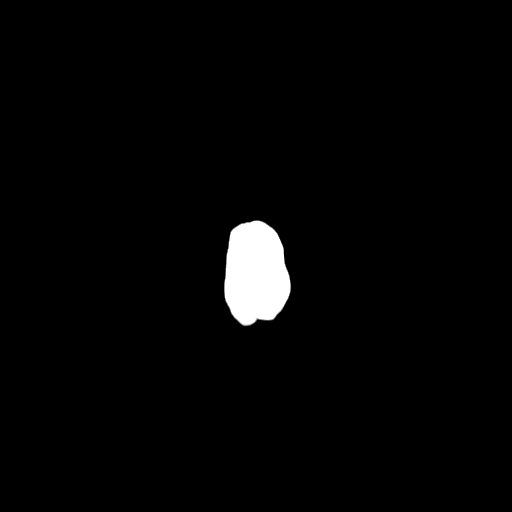
[im 28/30  bone]
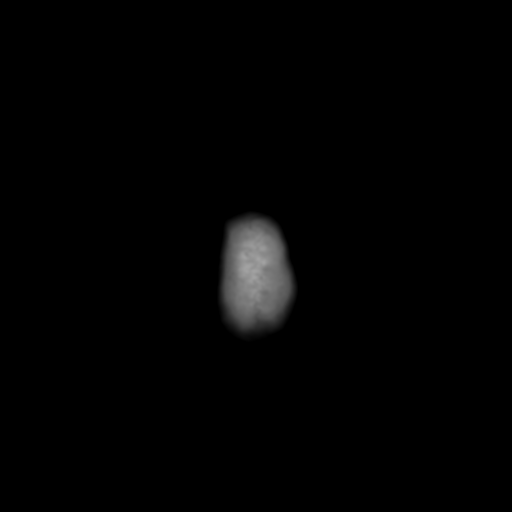

[Series 4: coronal soft · coronal · 0.30mm/px · 3 of 63 slices shown]
[im 21/63  brain]
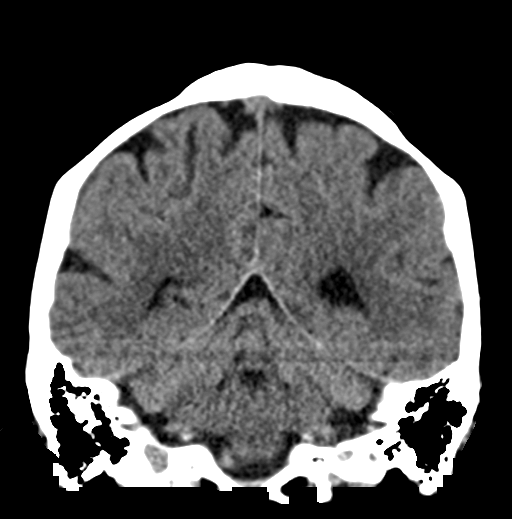
[im 28/63  brain]
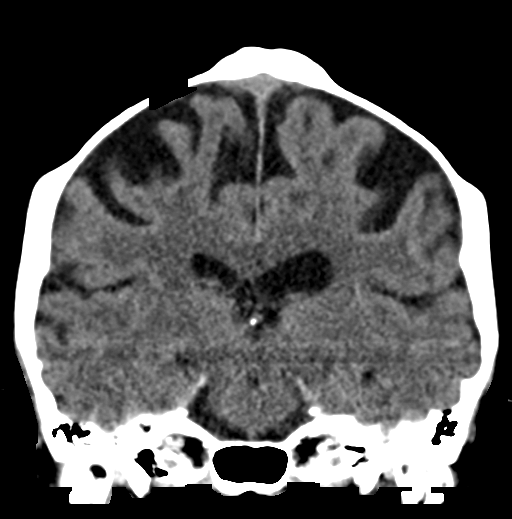
[im 35/63  brain]
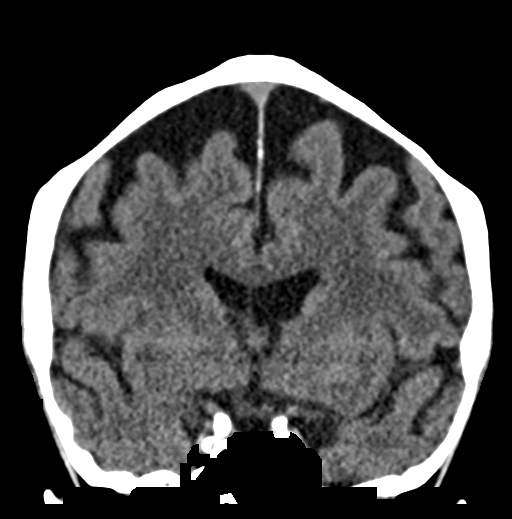

[Series 5: sag soft · sagittal · 0.29mm/px · 3 of 52 slices shown]
[im 18/52  brain]
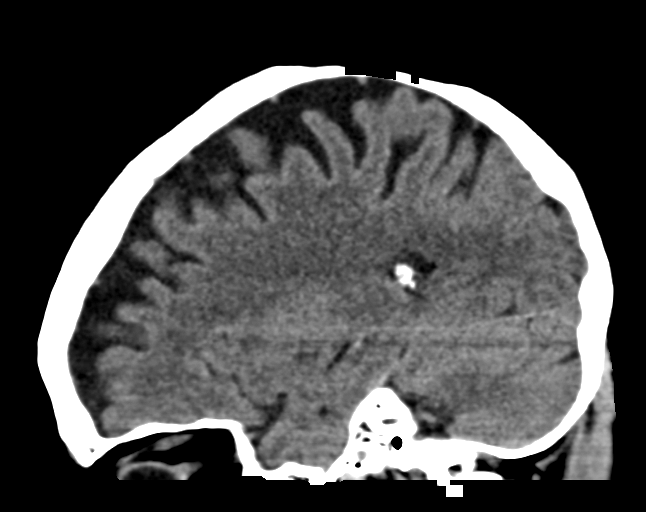
[im 26/52  brain]
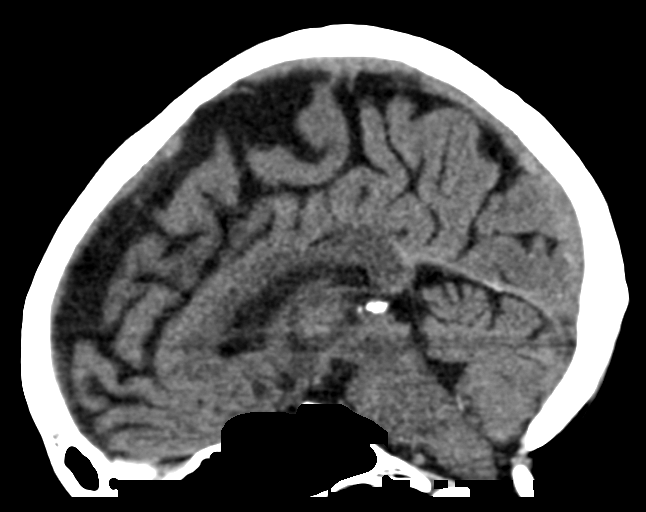
[im 35/52  brain]
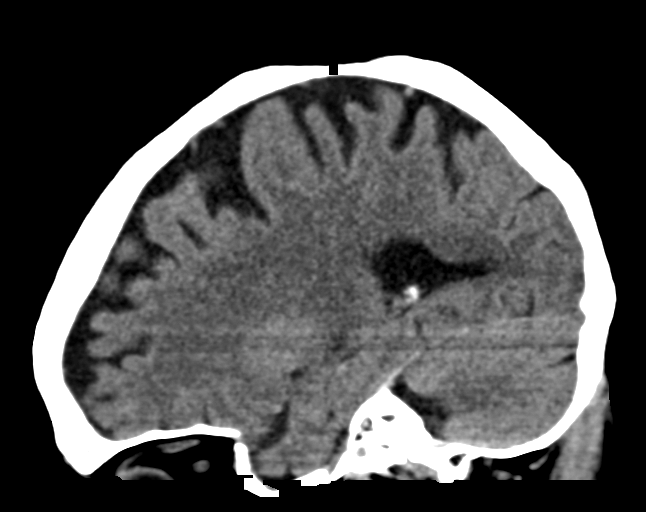

[15 of 47 positions shown; findings below may reference images not displayed]

FINDINGS: Brain: Generalized atrophy. Normal ventricular morphology. No
midline shift or mass effect. Otherwise normal appearance of brain
parenchyma. No intracranial hemorrhage, mass lesion,, or acute
infarction. No extra-axial fluid collections.

Vascular: Atherosclerotic calcification of internal carotid and
vertebral arteries at skull base

Skull: Biparietal thinning. Demineralized. No acute osseous
findings.

Sinuses/Orbits: Clear

Other: N/A
IMPRESSION: Generalized atrophy.

No acute intracranial abnormalities.
